# Patient Record
Sex: Female | Born: 1950 | Race: Black or African American | Hispanic: No | Smoking: Never smoker
Health system: Southern US, Community
[De-identification: ages and names within clinical notes are randomized; demographics above are authoritative.]

## PROBLEM LIST (undated history)

## (undated) DIAGNOSIS — I10 Essential (primary) hypertension: Secondary | ICD-10-CM

## (undated) DIAGNOSIS — G43909 Migraine, unspecified, not intractable, without status migrainosus: Secondary | ICD-10-CM

## (undated) DIAGNOSIS — K219 Gastro-esophageal reflux disease without esophagitis: Secondary | ICD-10-CM

## (undated) DIAGNOSIS — E119 Type 2 diabetes mellitus without complications: Secondary | ICD-10-CM

## (undated) DIAGNOSIS — M719 Bursopathy, unspecified: Secondary | ICD-10-CM

## (undated) HISTORY — PX: LUNG REMOVAL, PARTIAL: SHX233

## (undated) HISTORY — PX: ABDOMINAL HYSTERECTOMY: SHX81

---

## 2004-09-26 ENCOUNTER — Emergency Department (HOSPITAL_COMMUNITY): Admission: EM | Admit: 2004-09-26 | Discharge: 2004-09-26 | Payer: Self-pay | Admitting: Emergency Medicine

## 2014-10-13 ENCOUNTER — Encounter (HOSPITAL_COMMUNITY): Payer: Self-pay | Admitting: Emergency Medicine

## 2014-10-13 ENCOUNTER — Emergency Department (HOSPITAL_COMMUNITY): Payer: BC Managed Care – PPO

## 2014-10-13 ENCOUNTER — Observation Stay (HOSPITAL_COMMUNITY)
Admission: EM | Admit: 2014-10-13 | Discharge: 2014-10-15 | Disposition: A | Discharge status: Home / Self Care | Payer: BC Managed Care – PPO | Attending: Internal Medicine | Admitting: Internal Medicine

## 2014-10-13 DIAGNOSIS — Z90710 Acquired absence of both cervix and uterus: Secondary | ICD-10-CM | POA: Diagnosis not present

## 2014-10-13 DIAGNOSIS — Z885 Allergy status to narcotic agent status: Secondary | ICD-10-CM | POA: Insufficient documentation

## 2014-10-13 DIAGNOSIS — Z79899 Other long term (current) drug therapy: Secondary | ICD-10-CM | POA: Insufficient documentation

## 2014-10-13 DIAGNOSIS — G43909 Migraine, unspecified, not intractable, without status migrainosus: Secondary | ICD-10-CM | POA: Diagnosis not present

## 2014-10-13 DIAGNOSIS — Z888 Allergy status to other drugs, medicaments and biological substances status: Secondary | ICD-10-CM | POA: Diagnosis not present

## 2014-10-13 DIAGNOSIS — I951 Orthostatic hypotension: Secondary | ICD-10-CM | POA: Diagnosis not present

## 2014-10-13 DIAGNOSIS — E119 Type 2 diabetes mellitus without complications: Secondary | ICD-10-CM

## 2014-10-13 DIAGNOSIS — I10 Essential (primary) hypertension: Secondary | ICD-10-CM | POA: Diagnosis not present

## 2014-10-13 DIAGNOSIS — R55 Syncope and collapse: Secondary | ICD-10-CM

## 2014-10-13 DIAGNOSIS — R42 Dizziness and giddiness: Secondary | ICD-10-CM

## 2014-10-13 HISTORY — DX: Migraine, unspecified, not intractable, without status migrainosus: G43.909

## 2014-10-13 HISTORY — DX: Type 2 diabetes mellitus without complications: E11.9

## 2014-10-13 HISTORY — DX: Essential (primary) hypertension: I10

## 2014-10-13 LAB — TROPONIN I: Troponin I: <0.03 ng/mL (ref <0.031)

## 2014-10-13 LAB — BASIC METABOLIC PANEL
ANION GAP: 11 (ref 5–15)
BUN: 15 mg/dL (ref 6–20)
CO2: 28 mmol/L (ref 22–32)
CREATININE: 1.14 mg/dL — AB (ref 0.44–1.00)
Calcium: 9.6 mg/dL (ref 8.9–10.3)
Chloride: 105 mmol/L (ref 101–111)
GFR calc Af Amer: 58 mL/min — ABNORMAL LOW (ref >60)
GFR, EST NON AFRICAN AMERICAN: 50 mL/min — AB (ref >60)
Glucose, Bld: 149 mg/dL — ABNORMAL HIGH (ref 65–99)
POTASSIUM: 3.4 mmol/L — AB (ref 3.5–5.1)
SODIUM: 144 mmol/L (ref 135–145)

## 2014-10-13 LAB — CBC
HEMATOCRIT: 39.6 % (ref 36.0–46.0)
HEMOGLOBIN: 12.4 g/dL (ref 12.0–15.0)
MCH: 27.6 pg (ref 26.0–34.0)
MCHC: 31.3 g/dL (ref 30.0–36.0)
MCV: 88.2 fL (ref 78.0–100.0)
PLATELETS: 301 10*3/uL (ref 150–400)
RBC: 4.49 MIL/uL (ref 3.87–5.11)
RDW: 13.5 % (ref 11.5–15.5)
WBC: 8.4 10*3/uL (ref 4.0–10.5)

## 2014-10-13 LAB — URINALYSIS, ROUTINE W REFLEX MICROSCOPIC
Glucose, UA: NEGATIVE mg/dL
HGB URINE DIPSTICK: NEGATIVE
KETONES UR: NEGATIVE mg/dL
NITRITE: NEGATIVE
Protein, ur: NEGATIVE mg/dL
Specific Gravity, Urine: 1.022 (ref 1.005–1.030)
UROBILINOGEN UA: 1 mg/dL (ref 0.0–1.0)
pH: 7.5 (ref 5.0–8.0)

## 2014-10-13 LAB — URINE MICROSCOPIC-ADD ON

## 2014-10-13 LAB — GLUCOSE, CAPILLARY
GLUCOSE-CAPILLARY: 133 mg/dL — AB (ref 65–99)
GLUCOSE-CAPILLARY: 154 mg/dL — AB (ref 65–99)

## 2014-10-13 MED ORDER — LABETALOL HCL 300 MG PO TABS
300.0000 mg | ORAL_TABLET | Freq: Two times a day (BID) | ORAL | Status: DC
  Administered 2014-10-13: 300 mg via ORAL
  Filled 2014-10-13 (×3): qty 1

## 2014-10-13 MED ORDER — ACETAMINOPHEN 650 MG RE SUPP: 650.0000 mg | Freq: Four times a day (QID) | RECTAL | Status: DC | PRN

## 2014-10-13 MED ORDER — DULOXETINE HCL 60 MG PO CPEP
60.0000 mg | ORAL_CAPSULE | Freq: Every day | ORAL | Status: DC
  Administered 2014-10-14 – 2014-10-15 (×2): 60 mg via ORAL
  Filled 2014-10-13 (×2): qty 1

## 2014-10-13 MED ORDER — SODIUM CHLORIDE 0.9 % IV BOLUS (SEPSIS)
1000.0000 mL | Freq: Once | INTRAVENOUS | Status: AC
  Administered 2014-10-13: 1000 mL via INTRAVENOUS

## 2014-10-13 MED ORDER — ONDANSETRON HCL 4 MG/2ML IJ SOLN: 4.0000 mg | Freq: Four times a day (QID) | INTRAMUSCULAR | Status: DC | PRN

## 2014-10-13 MED ORDER — MECLIZINE HCL 25 MG PO TABS
25.0000 mg | ORAL_TABLET | Freq: Once | ORAL | Status: AC
  Administered 2014-10-13: 25 mg via ORAL
  Filled 2014-10-13: qty 1

## 2014-10-13 MED ORDER — PANTOPRAZOLE SODIUM 40 MG PO TBEC
40.0000 mg | DELAYED_RELEASE_TABLET | Freq: Every day | ORAL | Status: DC
  Administered 2014-10-13 – 2014-10-15 (×3): 40 mg via ORAL
  Filled 2014-10-13 (×3): qty 1

## 2014-10-13 MED ORDER — SODIUM CHLORIDE 0.9 % IV SOLN
INTRAVENOUS | Status: DC
  Administered 2014-10-13 – 2014-10-15 (×3): via INTRAVENOUS

## 2014-10-13 MED ORDER — ACETAMINOPHEN 325 MG PO TABS
650.0000 mg | ORAL_TABLET | Freq: Four times a day (QID) | ORAL | Status: DC | PRN
  Administered 2014-10-13 – 2014-10-14 (×3): 650 mg via ORAL
  Filled 2014-10-13 (×3): qty 2

## 2014-10-13 MED ORDER — POTASSIUM CHLORIDE CRYS ER 20 MEQ PO TBCR
40.0000 meq | EXTENDED_RELEASE_TABLET | Freq: Once | ORAL | Status: AC
  Administered 2014-10-13: 40 meq via ORAL
  Filled 2014-10-13: qty 2

## 2014-10-13 MED ORDER — INSULIN ASPART 100 UNIT/ML ~~LOC~~ SOLN: 0.0000 [IU] | Freq: Three times a day (TID) | SUBCUTANEOUS | Status: DC

## 2014-10-13 MED ORDER — ONDANSETRON HCL 4 MG PO TABS: 4.0000 mg | ORAL_TABLET | Freq: Four times a day (QID) | ORAL | Status: DC | PRN

## 2014-10-13 NOTE — ED Notes (Addendum)
[place] to [place] pt can go at 2:20 pm...klj

## 2014-10-13 NOTE — ED Notes (Signed)
Bed: WA03 Expected date:  Expected time:  Means of arrival:  Comments: EMS- syncope, dizziness

## 2014-10-13 NOTE — H&P (Signed)
PCP:   Elspeth ChoERRELL,GRACE E, MD   Chief Complaint:  Passed out  HPI:  64 year old female who   has a past medical history of Hypertension; Diabetes mellitus without complication; and Migraines.  patient was brought to the hospital after she passed out at her workplace. As per patient when she woke up this morning she felt a little dizzy, she took her morning medications and went to her workplace. While she was fixing coffee patient doesn't remember but then found herself on the floor with vomitus around her. She also lost her bowel control and found stool all over the place. Patient denies any history of seizures, she denies chest pain or shortness of breath. She was not feeling sick before this happened. In the ED patient was found to have orthostatic hypotension with systolic blood pressure dropping around 20 points. Patient was given IV fluids and this time patient's blood pressure has improved. She does take labetalol 300 twice a day, Hyzaar 100/25 daily. Patient had an occipital nerve block 2 months ago for migraines  Allergies:   Allergies  Allergen Reactions  . Codeine Itching  . Tramadol Rash      Past Medical History  Diagnosis Date  . Hypertension   . Diabetes mellitus without complication   . Migraines     Past Surgical History  Procedure Laterality Date  . Abdominal hysterectomy    . Lung removal, partial      Prior to Admission medications   Medication Sig Start Date End Date Taking? Authorizing Provider  Clobetasol Propionate (TEMOVATE) 0.05 % external spray Apply topically as directed. 10/09/14  Yes Historical Provider, MD  DULoxetine (CYMBALTA) 60 MG capsule Take 60 mg by mouth daily. 10/05/14  Yes Historical Provider, MD  labetalol (NORMODYNE) 300 MG tablet Take 300 mg by mouth 2 (two) times daily. 10/12/14  Yes Historical Provider, MD  losartan-hydrochlorothiazide (HYZAAR) 100-25 MG per tablet Take 1 tablet by mouth daily. 09/28/14  Yes Historical Provider, MD    VENTOLIN HFA 108 (90 BASE) MCG/ACT inhaler Inhale 2 puffs into the lungs every 4 (four) hours as needed for wheezing or shortness of breath.  09/24/14  Yes Historical Provider, MD  SUMAtriptan (IMITREX) 50 MG tablet Take 1 tablet by mouth once as needed. Migraine.  May repeat every 2 hours if no relief.  Max 4 tabs/24 hours. 07/14/14   Historical Provider, MD  Allene DillonVANATOL LQ [id number] MG/15ML SOLN Take by mouth as directed. 09/23/14   Historical Provider, MD    Social History:  reports that she has never smoked. She does not have any smokeless tobacco history on file. She reports that she drinks alcohol. Her drug history is not on file.  No family history of stroke or cancer in  All the positives are listed in BOLD  Review of Systems:  HEENT: Headache, blurred vision, runny nose, sore throat Neck: Hypothyroidism, hyperthyroidism,,lymphadenopathy Chest : Shortness of breath, history of COPD, Asthma Heart : Chest pain, history of coronary arterey disease GI:  Nausea, vomiting, diarrhea, constipation, GERD GU: Dysuria, urgency, frequency of urination, hematuria Neuro: Stroke, seizures, syncope, migraine Psych: Depression, anxiety, hallucinations   Physical Exam: Blood pressure 107/71, pulse 69, temperature 97.8 F (36.6 C), temperature source Oral, resp. rate 18, SpO2 100 %. Constitutional:   Patient is a well-developed and well-nourished * in no acute distress and cooperative with exam. Head: Normocephalic and atraumatic Mouth: Mucus membranes moist Eyes: PERRL, EOMI, conjunctivae normal Neck: Supple, No Thyromegaly Cardiovascular: RRR, S1 normal, S2 normal Pulmonary/Chest:  CTAB, no wheezes, rales, or rhonchi Abdominal: Soft. Non-tender, non-distended, bowel sounds are normal, no masses, organomegaly, or guarding present.  Neurological: A&O x3, Strength is normal and symmetric bilaterally, cranial nerve II-XII are grossly intact, no focal motor deficit, sensory intact to light touch  bilaterally.  Extremities : No Cyanosis, Clubbing or Edema  Labs on Admission:  Basic Metabolic Panel:  Recent Labs Lab 10/13/14 1153  NA 144  K 3.4*  CL 105  CO2 28  GLUCOSE 149*  BUN 15  CREATININE 1.14*  CALCIUM 9.6   Liver Function Tests: No results for input(s): AST, ALT, ALKPHOS, BILITOT, PROT, ALBUMIN in the last 168 hours. No results for input(s): LIPASE, AMYLASE in the last 168 hours. No results for input(s): AMMONIA in the last 168 hours. CBC:  Recent Labs Lab 10/13/14 1153  WBC 8.4  HGB 12.4  HCT 39.6  MCV 88.2  PLT 301   Cardiac Enzymes:  Recent Labs Lab 10/13/14 1153  TROPONINI <0.03     Radiological Exams on Admission: Dg Chest 2 View  10/13/2014   CLINICAL DATA:  Dizziness and syncopal episode  EXAM: CHEST  2 VIEW  COMPARISON:  10/20/2013  FINDINGS: Cardiac shadow is within normal limits. The lungs are clear bilaterally. No acute bony abnormality is seen.  IMPRESSION: No active cardiopulmonary disease.   Electronically Signed   By: Alcide Clever M.D.   On: 10/13/2014 12:56   Ct Head Wo Contrast  10/13/2014   CLINICAL DATA:  Making coffee at work and became dizzy and nauseated.  EXAM: CT HEAD WITHOUT CONTRAST  TECHNIQUE: Contiguous axial images were obtained from the base of the skull through the vertex without intravenous contrast.  COMPARISON:  09/26/2004.  FINDINGS: No evidence for acute infarction, hemorrhage, mass lesion, hydrocephalus, or extra-axial fluid. No atrophy or white matter disease. Intact calvarium. No acute sinus or mastoid disease. Similar appearance to priors.  IMPRESSION: Negative exam.   Electronically Signed   By: Davonna Belling M.D.   On: 10/13/2014 12:48    EKG: Independently reviewed. Sinus rhythm   Assessment/Plan Active Problems:   Syncope   HTN (hypertension)   Diabetes mellitus  Syncope versus seizure Patient had episode of loss of adjustments, also lost bowel control and had vomiting. We'll admit the patient under  telemetry, obtain serial cardiac enzymes. We'll also check EEG to rule out underlying seizure disorder. Patient does not have history of seizures in the past. CT scan head done today is negative for any acute abnormality.  Orthostatic hypotension Patient was found to be orthostatic with systolic blood pressure dropping more than 20 points. Will hold Hyzaar at this time, continue labetalol.  Diabetes mellitus Diet-controlled, will initiate sliding scale insulin with NovoLog.  DVT prophylaxis SCDs, patient does not want Lovenox as she remotely had a brain bleed seen on the MRI.  Code status: Full code  Family discussion: No family at bedside   Time Spent on Admission: 55 minutes  LAMA,GAGAN S [facility] Pager: [phone] 10/13/2014, 2:03 PM  If 7PM-7AM, please contact night-coverage  [email or web address]  Password [email or web address]

## 2014-10-13 NOTE — ED Notes (Signed)
Initial Contact - pt A+Ox4, reports feeling "not quite right" this AM, sts was making herself coffee at work and was suddenly dizzy and very nauseated.  Pt reports "then I woke up on the floor covered in vomit and poo".  Pt reports dizziness with position changes at this time.  Pt denies cp/palpitations or SOB now or prior to event.  NSR noted on monitor, no ectopy.  No active vomiting or nausea.  Abd s/nt/nd.  Skin PWD.  MAEI, neuros grossly intact.  Ambulatory with steady gait to void in BR.  Speaking full/clear sentences.  Changed to hospital gown, placed to cardiac/02 monitor.  NAD.

## 2014-10-13 NOTE — ED Notes (Signed)
Pt to CT

## 2014-10-13 NOTE — Progress Notes (Addendum)
1429 Attending MD paged (admission)  1430 Call from attending see new order

## 2014-10-13 NOTE — ED Provider Notes (Signed)
CSN: [id number]     Arrival date & time 10/13/14  1122 History   First MD Initiated Contact with Patient 10/13/14 1143     Chief Complaint  Patient presents with  . Dizziness    this AM  . Loss of Consciousness    this AM while at work, with vomiting/diarrhea     (Consider location/radiation/quality/duration/timing/severity/associated sxs/prior Treatment) HPI Comments: Pt comes in with c/o dizziness that started while fixing her coffee. Pt states that she woke up on the ground and had vomit and diarrhea all over her. She denies history of similar symptoms. She states that she is just tired now. She states that the dizziness was "swimming in her head". She denies vision changes, cp, sob. She states that she does have a history of headache and she has one today and she states that closing her eyes helps the problem.  The history is provided by the patient. No language interpreter was used.    Past Medical History  Diagnosis Date  . Hypertension   . Diabetes mellitus without complication   . Migraines    Past Surgical History  Procedure Laterality Date  . Abdominal hysterectomy    . Lung removal, partial     No family history on file. History  Substance Use Topics  . Smoking status: Never Smoker   . Smokeless tobacco: Not on file  . Alcohol Use: Yes     Comment: glass wine/night   OB History    No data available     Review of Systems  All other systems reviewed and are negative.     Allergies  Codeine and Tramadol  Home Medications   Prior to Admission medications   Medication Sig Start Date End Date Taking? Authorizing Provider  Clobetasol Propionate (TEMOVATE) 0.05 % external spray Apply topically as directed. 10/09/14  Yes Historical Provider, MD  DULoxetine (CYMBALTA) 60 MG capsule Take 60 mg by mouth daily. 10/05/14  Yes Historical Provider, MD  labetalol (NORMODYNE) 300 MG tablet Take 300 mg by mouth 2 (two) times daily. 10/12/14  Yes Historical Provider, MD   losartan-hydrochlorothiazide (HYZAAR) 100-25 MG per tablet Take 1 tablet by mouth daily. 09/28/14  Yes Historical Provider, MD  VENTOLIN HFA 108 (90 BASE) MCG/ACT inhaler Inhale 2 puffs into the lungs every 4 (four) hours as needed for wheezing or shortness of breath.  09/24/14  Yes Historical Provider, MD  SUMAtriptan (IMITREX) 50 MG tablet Take 1 tablet by mouth once as needed. Migraine.  May repeat every 2 hours if no relief.  Max 4 tabs/24 hours. 07/14/14   Historical Provider, MD  Allene DillonVANATOL LQ [phone] MG/15ML SOLN Take by mouth as directed. 09/23/14   Historical Provider, MD   BP 130/91 mmHg  Pulse 68  Temp(Src) 97.8 F (36.6 C) (Oral)  Resp 18  SpO2 99% Physical Exam  Constitutional: She is oriented to person, place, and time. She appears well-developed and well-nourished.  Cardiovascular: Normal rate and regular rhythm.   Pulmonary/Chest: Effort normal and breath sounds normal.  Abdominal: Soft. Bowel sounds are normal. There is no tenderness.  Musculoskeletal: Normal range of motion.  Neurological: She is alert and oriented to person, place, and time. Coordination normal.  No pronator drift. Normal finger to nose. Moving all extremities without any problem  Skin: Skin is warm and dry.  Psychiatric: She has a normal mood and affect.  Nursing note and vitals reviewed.   ED Course  Procedures (including critical care time) Labs Review Labs Reviewed  BASIC METABOLIC PANEL - Abnormal; Notable for the following:    Potassium 3.4 (*)    Glucose, Bld 149 (*)    Creatinine, Ser 1.14 (*)    GFR calc non Af Amer 50 (*)    GFR calc Af Amer 58 (*)    All other components within normal limits  URINALYSIS, ROUTINE W REFLEX MICROSCOPIC - Abnormal; Notable for the following:    Color, Urine AMBER (*)    APPearance TURBID (*)    Bilirubin Urine SMALL (*)    Leukocytes, UA SMALL (*)    All other components within normal limits  URINE MICROSCOPIC-ADD ON - Abnormal; Notable for the following:     Bacteria, UA MANY (*)    Casts HYALINE CASTS (*)    All other components within normal limits  CBC  TROPONIN I    Imaging Review Ct Head Wo Contrast  10/13/2014   CLINICAL DATA:  Making coffee at work and became dizzy and nauseated.  EXAM: CT HEAD WITHOUT CONTRAST  TECHNIQUE: Contiguous axial images were obtained from the base of the skull through the vertex without intravenous contrast.  COMPARISON:  09/26/2004.  FINDINGS: No evidence for acute infarction, hemorrhage, mass lesion, hydrocephalus, or extra-axial fluid. No atrophy or white matter disease. Intact calvarium. No acute sinus or mastoid disease. Similar appearance to priors.  IMPRESSION: Negative exam.   Electronically Signed   By: Davonna Belling M.D.   On: 10/13/2014 12:48     EKG Interpretation   Date/Time:  Tuesday Oct 13 2014 11:45:34 EDT Ventricular Rate:  66 PR Interval:  148 QRS Duration: 99 QT Interval:  432 QTC Calculation: 453 R Axis:   82 Text Interpretation:  Sinus rhythm Borderline right axis deviation  Borderline T abnormalities, lateral leads Confirmed by Rubin Payor  MD,  Harrold Donath [phone]) on 10/13/2014 1:13:47 PM      MDM   Final diagnoses:  Dizziness  Syncope, unspecified syncope type  Orthostatic hypotension    Pt is ambulatory on her own. Pt is orthostatic. Pt is okay to go home    Teressa Lower, NP 10/13/14 1334  Benjiman Core, MD 10/13/14 [phone]

## 2014-10-13 NOTE — ED Notes (Signed)
Pt return from radiology, resting on stretcher with eyes closed.  Denies needs/complaints at this time.  Call light in reach.  NAD.

## 2014-10-13 NOTE — ED Notes (Signed)
PER EMS - pt from work with unwitnessed syncopal episode and c/o dizziness this AM.  Pt reports vomiting and having diarrhea on herself during event.  Pt continues to c/o dizziness, reports improved with laying down and closing eyes.    4mg  Zofran IVP x1 - with improvement

## 2014-10-13 NOTE — Progress Notes (Signed)
Pt updated CM her pcp is Dr Landry Mellowerrell  EPIC updated

## 2014-10-14 ENCOUNTER — Observation Stay (HOSPITAL_COMMUNITY)
Admit: 2014-10-14 | Discharge: 2014-10-14 | Disposition: A | Discharge status: Home / Self Care | Payer: BC Managed Care – PPO | Attending: Family Medicine | Admitting: Family Medicine

## 2014-10-14 ENCOUNTER — Observation Stay (HOSPITAL_BASED_OUTPATIENT_CLINIC_OR_DEPARTMENT_OTHER): Payer: BC Managed Care – PPO

## 2014-10-14 DIAGNOSIS — R55 Syncope and collapse: Secondary | ICD-10-CM

## 2014-10-14 DIAGNOSIS — E119 Type 2 diabetes mellitus without complications: Secondary | ICD-10-CM | POA: Diagnosis not present

## 2014-10-14 DIAGNOSIS — I10 Essential (primary) hypertension: Secondary | ICD-10-CM | POA: Diagnosis not present

## 2014-10-14 LAB — COMPREHENSIVE METABOLIC PANEL
ALT: 15 U/L (ref 14–54)
ANION GAP: 8 (ref 5–15)
AST: 18 U/L (ref 15–41)
Albumin: 3.4 g/dL — ABNORMAL LOW (ref 3.5–5.0)
Alkaline Phosphatase: 94 U/L (ref 38–126)
BILIRUBIN TOTAL: 0.4 mg/dL (ref 0.3–1.2)
BUN: 16 mg/dL (ref 6–20)
CALCIUM: 9.5 mg/dL (ref 8.9–10.3)
CO2: 29 mmol/L (ref 22–32)
Chloride: 104 mmol/L (ref 101–111)
Creatinine, Ser: 1.09 mg/dL — ABNORMAL HIGH (ref 0.44–1.00)
GFR calc Af Amer: >60 mL/min (ref >60)
GFR, EST NON AFRICAN AMERICAN: 52 mL/min — AB (ref >60)
Glucose, Bld: 149 mg/dL — ABNORMAL HIGH (ref 65–99)
Potassium: 3.5 mmol/L (ref 3.5–5.1)
SODIUM: 141 mmol/L (ref 135–145)
Total Protein: 7 g/dL (ref 6.5–8.1)

## 2014-10-14 LAB — CBC
HEMATOCRIT: 39 % (ref 36.0–46.0)
HEMOGLOBIN: 12 g/dL (ref 12.0–15.0)
MCH: 27.6 pg (ref 26.0–34.0)
MCHC: 30.8 g/dL (ref 30.0–36.0)
MCV: 89.7 fL (ref 78.0–100.0)
Platelets: 312 10*3/uL (ref 150–400)
RBC: 4.35 MIL/uL (ref 3.87–5.11)
RDW: 13.8 % (ref 11.5–15.5)
WBC: 8.6 10*3/uL (ref 4.0–10.5)

## 2014-10-14 LAB — GLUCOSE, CAPILLARY
GLUCOSE-CAPILLARY: 95 mg/dL (ref 65–99)
Glucose-Capillary: 97 mg/dL (ref 65–99)
Glucose-Capillary: 100 mg/dL — ABNORMAL HIGH (ref 65–99)
Glucose-Capillary: 92 mg/dL (ref 65–99)

## 2014-10-14 LAB — TROPONIN I

## 2014-10-14 NOTE — Progress Notes (Signed)
EEG completed; results pending.    

## 2014-10-14 NOTE — Progress Notes (Signed)
  Echocardiogram 2D Echocardiogram has been performed.  Janalyn HarderWest, Veeda Virgo R 10/14/2014, 2:17 PM

## 2014-10-14 NOTE — Progress Notes (Signed)
[facility] PROGRESS NOTE  Mercedes Riggs EAV:[id number] DOB: 01-28-1951 DOA: 10/13/2014 PCP: Elspeth Cho, MD Interim summary: 64 year old female who  has a past medical history of Hypertension; Diabetes mellitus without complication; and Migraines. patient was brought to the hospital after she passed out at her workplace. Assessment/Plan: 1. Syncope: Unclear etiology, probably orthostatic.  Stopped BP meds and her repeat orthostatics negative.  EEG negative for seizures.  Echocardiogram pending.  Over night tele did not show any arrhythmias   Migraine headache: improved with tylenol.   Hypertension: controlled.      Code Status: full code Family Communication: son at bedside Disposition Plan: pending.    Consultants:  none  Procedures:  EEG  ECHO.   Antibiotics:  none  HPI/Subjective: Migraine headache, needs tylenol.   Objective: Filed Vitals:   10/14/14 1403  BP:   Pulse:   Temp: 98.1 F (36.7 C)  Resp: 18    Intake/Output Summary (Last 24 hours) at 10/14/14 1615 Last data filed at 10/14/14 1500  Gross per 24 hour  Intake   1105 ml  Output      3 ml  Net   1102 ml   Filed Weights   10/13/14 1446  Weight: 77.6 kg (171 lb 1.2 oz)    Exam:   General:  Alert afebrile comfortable  Cardiovascular: s1s2  Respiratory: CTAB.   Abdomen: SOFT non tender non distended, bowel sounds heard.   Musculoskeletal: no pedal edema.   Data Reviewed: Basic Metabolic Panel:  Recent Labs Lab 10/13/14 1153 10/14/14 0258  NA 144 141  K 3.4* 3.5  CL 105 104  CO2 28 29  GLUCOSE 149* 149*  BUN 15 16  CREATININE 1.14* 1.09*  CALCIUM 9.6 9.5   Liver Function Tests:  Recent Labs Lab 10/14/14 0258  AST 18  ALT 15  ALKPHOS 94  BILITOT 0.4  PROT 7.0  ALBUMIN 3.4*   No results for input(s): LIPASE, AMYLASE in the last 168 hours. No results for input(s): AMMONIA in the last 168 hours. CBC:  Recent Labs Lab 10/13/14 1153  10/14/14 0258  WBC 8.4 8.6  HGB 12.4 12.0  HCT 39.6 39.0  MCV 88.2 89.7  PLT 301 312   Cardiac Enzymes:  Recent Labs Lab 10/13/14 1153 10/13/14 1510 10/13/14 2120 10/14/14 0258  TROPONINI <0.03 <0.03 <0.03 <0.03   BNP (last 3 results) No results for input(s): BNP in the last 8760 hours.  ProBNP (last 3 results) No results for input(s): PROBNP in the last 8760 hours.  CBG:  Recent Labs Lab 10/13/14 1656 10/13/14 2228 10/14/14 0809 10/14/14 1139  GLUCAP 133* 154* 97 95    No results found for this or any previous visit (from the past 240 hour(s)).   Studies: Dg Chest 2 View  10/13/2014   CLINICAL DATA:  Dizziness and syncopal episode  EXAM: CHEST  2 VIEW  COMPARISON:  10/20/2013  FINDINGS: Cardiac shadow is within normal limits. The lungs are clear bilaterally. No acute bony abnormality is seen.  IMPRESSION: No active cardiopulmonary disease.   Electronically Signed   By: Alcide Clever M.D.   On: 10/13/2014 12:56   Ct Head Wo Contrast  10/13/2014   CLINICAL DATA:  Making coffee at work and became dizzy and nauseated.  EXAM: CT HEAD WITHOUT CONTRAST  TECHNIQUE: Contiguous axial images were obtained from the base of the skull through the vertex without intravenous contrast.  COMPARISON:  09/26/2004.  FINDINGS: No evidence for acute infarction, hemorrhage, mass lesion, hydrocephalus,  or extra-axial fluid. No atrophy or white matter disease. Intact calvarium. No acute sinus or mastoid disease. Similar appearance to priors.  IMPRESSION: Negative exam.   Electronically Signed   By: Davonna BellingJohn  Curnes M.D.   On: 10/13/2014 12:48    Scheduled Meds: . DULoxetine  60 mg Oral Daily  . pantoprazole  40 mg Oral Daily   Continuous Infusions: . sodium chloride 75 mL/hr at 10/13/14 1652    Active Problems:   Syncope   HTN (hypertension)   Diabetes mellitus    Time spent: 25 min    Mercedes Riggs  [facility] Pager [phone]  If 7PM-7AM, please contact night-coverage at  [email or web address], password [email or web address] 10/14/2014, 4:15 PM  LOS: 1 day

## 2014-10-14 NOTE — Procedures (Addendum)
History: Previous aneurysm clippings with bilateral craniotomies.   Sedation: None  Technique: This is a 19 channel routine scalp EEG performed at the bedside with bipolar and monopolar montages arranged in accordance to the international 10/20 system of electrode placement. One channel was dedicated to EKG recording.    Background: The background consists of intermixed alpha and beta activities. There is a well defined posterior dominant rhythm of 9 Hz that attenuates with eye opening. Sleep is recorded with normal appearing structures.   She has a prominent bitemporal wicket rhythm that is sharply contoured.  Photic stimulation: Physiologic driving is not performed  EEG Abnormalities: None  Clinical Interpretation: This normal EEG is recorded in the waking and sleep state. . There was no seizure or seizure predisposition recorded on this study.   Mercedes Riggs [name], MD [facility] [phone]  If 7pm- 7am, please page neurology on call as listed in AMION.

## 2014-10-15 ENCOUNTER — Other Ambulatory Visit: Payer: Self-pay | Admitting: Cardiology

## 2014-10-15 DIAGNOSIS — R55 Syncope and collapse: Secondary | ICD-10-CM

## 2014-10-15 DIAGNOSIS — E119 Type 2 diabetes mellitus without complications: Secondary | ICD-10-CM | POA: Diagnosis not present

## 2014-10-15 LAB — HEMOGLOBIN A1C
HEMOGLOBIN A1C: 6.5 % — AB (ref 4.8–5.6)
MEAN PLASMA GLUCOSE: 140 mg/dL

## 2014-10-15 LAB — GLUCOSE, CAPILLARY: Glucose-Capillary: 112 mg/dL — ABNORMAL HIGH (ref 65–99)

## 2014-10-15 MED ORDER — HEART RATE MONITOR MISC: 1.0000 [IU] | Status: DC

## 2014-10-15 MED ORDER — HYDRALAZINE HCL 20 MG/ML IJ SOLN
5.0000 mg | INTRAMUSCULAR | Status: DC | PRN
  Administered 2014-10-15: 5 mg via INTRAVENOUS
  Filled 2014-10-15: qty 1

## 2014-10-15 NOTE — Discharge Summary (Signed)
Physician Discharge Summary  Mercedes AGENT YQI:[id number] DOB: Jun 21, 1950 DOA: 10/13/2014  PCP: Elspeth Cho, MD  Admit date: 10/13/2014 Discharge date: 10/15/2014  Time spent: 30 minutes  Recommendations for Outpatient Follow-up:  1. Follow up with PCP in one week 2. Follow up with cardiology for event monitor placement.  3. Recommend using CPAP machine for sleep apnea.   Discharge Diagnoses:  Active Problems:   Syncope   HTN (hypertension)   Diabetes mellitus   Discharge Condition: improved.   Diet recommendation: low sodium and carb modified diet  Filed Weights   10/13/14 1446  Weight: 77.6 kg (171 lb 1.2 oz)    History of present illness:  64 year old female who has a past medical history of Hypertension; Diabetes mellitus without complication; and Migraines. patient was brought to the hospital after she passed out at her workplace.  Hospital Course:  1. Syncope: Unclear etiology, probably orthostatic.  Stopped BP meds and her repeat orthostatics negative.  EEG negative for seizures.  Echocardiogram does not show anysignificant abnormalities.  Over night tele did not show any arrhythmias. Outpatient event monitor placement. Called cardiology.    Migraine headache: improved with tylenol.   Hypertension: controlled.   Procedures:  Riggs  Consultations:  Riggs  Discharge Exam: Filed Vitals:   10/15/14 0638  BP: 157/84  Pulse: 73  Temp:   Resp:     General: alert afebrile comfortable Cardiovascular: s1s2 Respiratory: ctab  Discharge Instructions   Discharge Instructions    Diet - low sodium heart healthy    Complete by:  As directed      Discharge instructions    Complete by:  As directed   Please follow up with cardiology for event monitor to be placed to check for arrhthymias.  We have stopped losartan and HCTZ as you were orthostatic and dehydrated. Please follow up with PCP regarding resuming the medication.   Please use CPAP  every day.          Current Discharge Medication List    CONTINUE these medications which have NOT CHANGED   Details  Clobetasol Propionate (TEMOVATE) 0.05 % external spray Apply topically as directed.    DULoxetine (CYMBALTA) 60 MG capsule Take 60 mg by mouth daily.    labetalol (NORMODYNE) 300 MG tablet Take 300 mg by mouth 2 (two) times daily.    VENTOLIN HFA 108 (90 BASE) MCG/ACT inhaler Inhale 2 puffs into the lungs every 4 (four) hours as needed for wheezing or shortness of breath.  Refills: 11    SUMAtriptan (IMITREX) 50 MG tablet Take 1 tablet by mouth once as needed. Migraine.  May repeat every 2 hours if no relief.  Max 4 tabs/24 hours. Refills: 5    VANATOL LQ 50-325-40 MG/15ML SOLN Take by mouth as directed.      STOP taking these medications     losartan-hydrochlorothiazide (HYZAAR) 100-25 MG per tablet        Allergies  Allergen Reactions  . Codeine Itching  . Tramadol Rash   Follow-up Information    Follow up with [facility], MD. Schedule an appointment as soon as possible for a visit in 1 week.   Specialty:  Internal Medicine   Contact information:   [address] [place] [address] [phone]       Follow up with [facility].   Specialty:  Cardiology   Why:  for event monitor to be placed for syncope.    Contact information:  [address], [address] [place] [id number] [phone]       The results of significant diagnostics from this hospitalization (including imaging, microbiology, ancillary and laboratory) are listed below for reference.    Significant Diagnostic Studies: Dg Chest 2 View  10/13/2014   CLINICAL DATA:  Dizziness and syncopal episode  EXAM: CHEST  2 VIEW  COMPARISON:  10/20/2013  FINDINGS: Cardiac shadow is within normal limits. The lungs are clear bilaterally. No acute bony abnormality is seen.  IMPRESSION: No active cardiopulmonary disease.    Electronically Signed   By: Alcide CleverMark  Lukens M.D.   On: 10/13/2014 12:56   Ct Head Wo Contrast  10/13/2014   CLINICAL DATA:  Making coffee at work and became dizzy and nauseated.  EXAM: CT HEAD WITHOUT CONTRAST  TECHNIQUE: Contiguous axial images were obtained from the base of the skull through the vertex without intravenous contrast.  COMPARISON:  09/26/2004.  FINDINGS: No evidence for acute infarction, hemorrhage, mass lesion, hydrocephalus, or extra-axial fluid. No atrophy or white matter disease. Intact calvarium. No acute sinus or mastoid disease. Similar appearance to priors.  IMPRESSION: Negative exam.   Electronically Signed   By: Davonna BellingJohn  Curnes M.D.   On: 10/13/2014 12:48    Microbiology: No results found for this or any previous visit (from the past 240 hour(s)).   Labs: Basic Metabolic Panel:  Recent Labs Lab 10/13/14 1153 10/14/14 0258  NA 144 141  K 3.4* 3.5  CL 105 104  CO2 28 29  GLUCOSE 149* 149*  BUN 15 16  CREATININE 1.14* 1.09*  CALCIUM 9.6 9.5   Liver Function Tests:  Recent Labs Lab 10/14/14 0258  AST 18  ALT 15  ALKPHOS 94  BILITOT 0.4  PROT 7.0  ALBUMIN 3.4*   No results for input(s): LIPASE, AMYLASE in the last 168 hours. No results for input(s): AMMONIA in the last 168 hours. CBC:  Recent Labs Lab 10/13/14 1153 10/14/14 0258  WBC 8.4 8.6  HGB 12.4 12.0  HCT 39.6 39.0  MCV 88.2 89.7  PLT 301 312   Cardiac Enzymes:  Recent Labs Lab 10/13/14 1153 10/13/14 1510 10/13/14 2120 10/14/14 0258  TROPONINI <0.03 <0.03 <0.03 <0.03   BNP: BNP (last 3 results) No results for input(s): BNP in the last 8760 hours.  ProBNP (last 3 results) No results for input(s): PROBNP in the last 8760 hours.  CBG:  Recent Labs Lab 10/14/14 0809 10/14/14 1139 10/14/14 1713 10/14/14 2151 10/15/14 0752  GLUCAP 97 95 100* 92 112*       Signed:  Mairyn Lenahan  [facility] 10/15/2014, 10:42 AM

## 2014-10-29 ENCOUNTER — Ambulatory Visit (INDEPENDENT_AMBULATORY_CARE_PROVIDER_SITE_OTHER): Payer: BC Managed Care – PPO

## 2014-10-29 DIAGNOSIS — R55 Syncope and collapse: Secondary | ICD-10-CM | POA: Diagnosis not present

## 2014-11-16 ENCOUNTER — Other Ambulatory Visit: Payer: Self-pay

## 2014-12-09 ENCOUNTER — Telehealth: Payer: Self-pay | Admitting: *Deleted

## 2014-12-09 NOTE — Telephone Encounter (Signed)
Spoke to patient. Result given . Verbalized understanding  

## 2014-12-09 NOTE — Telephone Encounter (Signed)
-----   Message from Allayne ButcherBrittainy M Simmons, [place]-C sent at 12/04/2014  9:10 AM EDT ----- NSR Normal event monitor. No arrhythmia. Please notify patient of findings.

## 2016-03-06 ENCOUNTER — Emergency Department (HOSPITAL_COMMUNITY)
Admission: EM | Admit: 2016-03-06 | Discharge: 2016-03-06 | Disposition: A | Discharge status: Home / Self Care | Payer: BC Managed Care – PPO | Attending: Emergency Medicine | Admitting: Emergency Medicine

## 2016-03-06 ENCOUNTER — Emergency Department (HOSPITAL_COMMUNITY): Payer: BC Managed Care – PPO

## 2016-03-06 ENCOUNTER — Encounter (HOSPITAL_COMMUNITY): Payer: Self-pay

## 2016-03-06 DIAGNOSIS — I10 Essential (primary) hypertension: Secondary | ICD-10-CM | POA: Diagnosis not present

## 2016-03-06 DIAGNOSIS — R112 Nausea with vomiting, unspecified: Secondary | ICD-10-CM

## 2016-03-06 DIAGNOSIS — E119 Type 2 diabetes mellitus without complications: Secondary | ICD-10-CM | POA: Diagnosis not present

## 2016-03-06 DIAGNOSIS — R55 Syncope and collapse: Secondary | ICD-10-CM | POA: Insufficient documentation

## 2016-03-06 HISTORY — DX: Gastro-esophageal reflux disease without esophagitis: K21.9

## 2016-03-06 LAB — COMPREHENSIVE METABOLIC PANEL
ALT: 15 U/L (ref 14–54)
AST: 19 U/L (ref 15–41)
Albumin: 3.9 g/dL (ref 3.5–5.0)
Alkaline Phosphatase: 121 U/L (ref 38–126)
Anion gap: 9 (ref 5–15)
BUN: 12 mg/dL (ref 6–20)
CHLORIDE: 105 mmol/L (ref 101–111)
CO2: 28 mmol/L (ref 22–32)
Calcium: 9.7 mg/dL (ref 8.9–10.3)
Creatinine, Ser: 1.16 mg/dL — ABNORMAL HIGH (ref 0.44–1.00)
GFR, EST AFRICAN AMERICAN: 56 mL/min — AB (ref >60)
GFR, EST NON AFRICAN AMERICAN: 48 mL/min — AB (ref >60)
Glucose, Bld: 110 mg/dL — ABNORMAL HIGH (ref 65–99)
POTASSIUM: 3.9 mmol/L (ref 3.5–5.1)
Sodium: 142 mmol/L (ref 135–145)
Total Bilirubin: 0.9 mg/dL (ref 0.3–1.2)
Total Protein: 7.2 g/dL (ref 6.5–8.1)

## 2016-03-06 LAB — CBC WITH DIFFERENTIAL/PLATELET
Basophils Absolute: 0 10*3/uL (ref 0.0–0.1)
Basophils Relative: 0 %
EOS ABS: 0.2 10*3/uL (ref 0.0–0.7)
Eosinophils Relative: 2 %
HCT: 39.7 % (ref 36.0–46.0)
HEMOGLOBIN: 12.4 g/dL (ref 12.0–15.0)
Lymphocytes Relative: 30 %
Lymphs Abs: 2.6 10*3/uL (ref 0.7–4.0)
MCH: 25.9 pg — ABNORMAL LOW (ref 26.0–34.0)
MCHC: 31.2 g/dL (ref 30.0–36.0)
MCV: 83.1 fL (ref 78.0–100.0)
Monocytes Absolute: 0.6 10*3/uL (ref 0.1–1.0)
Monocytes Relative: 7 %
NEUTROS PCT: 61 %
Neutro Abs: 5.4 10*3/uL (ref 1.7–7.7)
PLATELETS: 263 10*3/uL (ref 150–400)
RBC: 4.78 MIL/uL (ref 3.87–5.11)
RDW: 14.7 % (ref 11.5–15.5)
WBC: 8.8 10*3/uL (ref 4.0–10.5)

## 2016-03-06 LAB — TROPONIN I

## 2016-03-06 LAB — LIPASE, BLOOD: LIPASE: 49 U/L (ref 11–51)

## 2016-03-06 MED ORDER — SUMATRIPTAN 20 MG/ACT NA SOLN
20.0000 mg | Freq: Once | NASAL | Status: DC
  Filled 2016-03-06 (×2): qty 1

## 2016-03-06 MED ORDER — SODIUM CHLORIDE 0.9 % IV BOLUS (SEPSIS)
1000.0000 mL | Freq: Once | INTRAVENOUS | Status: AC
  Administered 2016-03-06: 1000 mL via INTRAVENOUS

## 2016-03-06 MED ORDER — ONDANSETRON HCL 4 MG PO TABS: 4.0000 mg | ORAL_TABLET | Freq: Four times a day (QID) | ORAL | 0 refills | Status: DC | PRN

## 2016-03-06 MED ORDER — PROCHLORPERAZINE EDISYLATE 5 MG/ML IJ SOLN
10.0000 mg | Freq: Once | INTRAMUSCULAR | Status: AC
  Administered 2016-03-06: 10 mg via INTRAVENOUS
  Filled 2016-03-06: qty 2

## 2016-03-06 MED ORDER — ACETAMINOPHEN 325 MG PO TABS
650.0000 mg | ORAL_TABLET | Freq: Once | ORAL | Status: AC
  Administered 2016-03-06: 650 mg via ORAL
  Filled 2016-03-06: qty 2

## 2016-03-06 NOTE — ED Triage Notes (Signed)
Pt arrives in no distress walked to bathroom though was brought in for near syncopal episode with minimal dizziness now and a headache to the L lower side of her head

## 2016-03-06 NOTE — Discharge Planning (Signed)
Follow-up appointment needs? n/a  Transportation needs? n/a  Medication assistance needs? Pt prescribed Rx and has [employer] insurance coverage.  Equipment needs? n/a                                      Oxygen needs? n/a  H.H needs? n/a  [facility] chart for possible CM needs.  No needs identified.   Jolita Haefner J. Lucretia RoersWood, RN, BSN, [place]CM [phone]

## 2016-03-06 NOTE — ED Notes (Signed)
Patient transported to X-ray 

## 2016-03-06 NOTE — Discharge Instructions (Signed)

## 2016-03-06 NOTE — ED Provider Notes (Signed)
[facility]-EMERGENCY DEPT Provider Note   CSN: [id number] Arrival date & time: 03/06/16  1108     History   Chief Complaint Chief Complaint  Patient presents with  . Near Syncope    pt while at work became nauseas threw up and than had a near syncopal episode     HPI   Mercedes Riggs is a 65 y.o. female with past medical history significant for diabetes, hypertension and migraine complaining of nausea onset yesterday, she went to work this morning she was in an elevator she felt nauseous and like she was going to pass out, she vomited in the elevator and was eased to the ground because she felt so lightheaded. She states that she felt like she had have a bowel movement but when she went to the restroom she did not defecate. Positive sick contact at home and that her 54-year-old family member also has nausea vomiting. She denies any abdominal pain, change in urination, chest pain, shortness of breath, palpitations, increasing peripheral edema, calf pain, leg swelling. She endorses a right occipital headache which is atypical exacerbation of her chronic migraine. She rates her pain at 6 out of 10. Also endorses dry cough onset several days ago. Brought in by EMS and given antinausea medication, she states that she is hungry now and would like to.  Past Medical History:  Diagnosis Date  . Diabetes mellitus without complication (HCC)   . GERD (gastroesophageal reflux disease)   . Hypertension   . Migraines     Patient Active Problem List   Diagnosis Date Noted  . Syncope 10/13/2014  . HTN (hypertension) 10/13/2014  . Diabetes mellitus (HCC) 10/13/2014    Past Surgical History:  Procedure Laterality Date  . ABDOMINAL HYSTERECTOMY    . LUNG REMOVAL, PARTIAL      OB History    No data available       Home Medications    Prior to Admission medications   Medication Sig Start Date End Date Taking? Authorizing Provider  Clobetasol Propionate (TEMOVATE) 0.05 % external spray Apply  topically as directed. 10/09/14   Historical Provider, MD  DULoxetine (CYMBALTA) 60 MG capsule Take 60 mg by mouth daily. 10/05/14   Historical Provider, MD  labetalol (NORMODYNE) 300 MG tablet Take 300 mg by mouth 2 (two) times daily. 10/12/14   Historical Provider, MD  Misc. Devices (HEART RATE MONITOR) MISC 1 Units by Does not apply route as directed. 10/15/14   Brittainy M Simmons, PA-C  ondansetron (ZOFRAN) 4 MG tablet Take 1 tablet (4 mg total) by mouth every 6 (six) hours as needed for nausea or vomiting. 03/06/16   Kwamaine Cuppett, PA-C  SUMAtriptan (IMITREX) 50 MG tablet Take 1 tablet by mouth once as needed. Migraine.  May repeat every 2 hours if no relief.  Max 4 tabs/24 hours. 07/14/14   Historical Provider, MD  Allene Dillon [phone] MG/15ML SOLN Take by mouth as directed. 09/23/14   Historical Provider, MD  VENTOLIN HFA 108 (90 BASE) MCG/ACT inhaler Inhale 2 puffs into the lungs every 4 (four) hours as needed for wheezing or shortness of breath.  09/24/14   Historical Provider, MD    Family History No family history on file.  Social History Social History  Substance Use Topics  . Smoking status: Never Smoker  . Smokeless tobacco: Never Used  . Alcohol use Yes     Comment: glass wine/night     Allergies   Codeine; Percocet [oxycodone-acetaminophen]; and Tramadol   Review  of Systems Review of Systems  10 systems reviewed and found to be negative, except as noted in the HPI.   Physical Exam Updated Vital Signs BP 150/85   Pulse 62   Temp 97.8 F (36.6 C) (Oral)   Resp 17   Ht 5\' 2"  (1.575 m)   Wt 68 kg   SpO2 95%   BMI 27.44 kg/m   Physical Exam  Constitutional: She is oriented to person, place, and time. She appears well-developed and well-nourished. No distress.  HENT:  Head: Normocephalic and atraumatic.  Mouth/Throat: Oropharynx is clear and moist.  Eyes: Conjunctivae and EOM are normal. Pupils are equal, round, and reactive to light.  Neck: Normal range of  motion.  Full range of motion to neck  Cardiovascular: Normal rate, regular rhythm and intact distal pulses.   Pulmonary/Chest: Effort normal and breath sounds normal.  Abdominal: Soft. There is no tenderness.  Musculoskeletal: Normal range of motion.  Neurological: She is alert and oriented to person, place, and time.  Follows commands, Clear, goal oriented speech, Strength is 5 out of 5x4 extremities, patient ambulates with a coordinated in nonantalgic gait. Sensation is grossly intact.   Skin: She is not diaphoretic.  Psychiatric: She has a normal mood and affect.  Nursing note and vitals reviewed.    ED Treatments / Results  Labs (all labs ordered are listed, but only abnormal results are displayed) Labs Reviewed  CBC WITH DIFFERENTIAL/PLATELET - Abnormal; Notable for the following:       Result Value   MCH 25.9 (*)    All other components within normal limits  COMPREHENSIVE METABOLIC PANEL - Abnormal; Notable for the following:    Glucose, Bld 110 (*)    Creatinine, Ser 1.16 (*)    GFR calc non Af Amer 48 (*)    GFR calc Af Amer 56 (*)    All other components within normal limits  LIPASE, BLOOD  TROPONIN I    EKG  EKG Interpretation  Date/Time:  Monday March 06 2016 11:18:40 EDT Ventricular Rate:  66 PR Interval:    QRS Duration: 99 QT Interval:  428 QTC Calculation: 449 R Axis:   78 Text Interpretation:  Sinus rhythm Borderline repolarization abnormality Confirmed by Rubin PayorPICKERING  MD, Harrold DonathNATHAN [phone]([id number]) on 03/06/2016 1:47:14 PM       Radiology Dg Chest 2 View  Result Date: 03/06/2016 CLINICAL DATA:  65 year old female with a near syncopal episode. Diabetes. Hypertension. Initial encounter. EXAM: CHEST  2 VIEW COMPARISON:  08/05/2015 and 10/13/2014 chest x-ray. FINDINGS: No infiltrate, congestive heart failure or pneumothorax. Calcified slightly tortuous aorta. Heart size within normal limits. Mild mediastinal shift to left.  Question prior left lung surgery. No  plain film evidence of pulmonary malignancy. No acute bony abnormality. IMPRESSION: No active cardiopulmonary disease. Aortic atherosclerosis. Electronically Signed   By: Lacy DuverneySteven  Olson M.D.   On: 03/06/2016 12:31    Procedures Procedures (including critical care time)  Medications Ordered in ED Medications  SUMAtriptan (IMITREX) nasal spray 20 mg (not administered)  prochlorperazine (COMPAZINE) injection 10 mg (10 mg Intravenous Given 03/06/16 1211)  sodium chloride 0.9 % bolus 1,000 mL (1,000 mLs Intravenous New Bag/Given 03/06/16 1210)  acetaminophen (TYLENOL) tablet 650 mg (650 mg Oral Given 03/06/16 1240)     Initial Impression / Assessment and Plan / ED Course  I have reviewed the triage vital signs and the nursing notes.  Pertinent labs & imaging results that were available during my care of the patient were reviewed  by me and considered in my medical decision making (see chart for details).  Clinical Course    Vitals:   03/06/16 1200 03/06/16 1241 03/06/16 1245 03/06/16 1315  BP: 149/94 180/95 167/89 150/85  Pulse: 68 67 63 62  Resp: 22 18 18 17   Temp:      TempSrc:      SpO2: 100% 96% 95% 95%  Weight:      Height:        Medications  SUMAtriptan (IMITREX) nasal spray 20 mg (not administered)  prochlorperazine (COMPAZINE) injection 10 mg (10 mg Intravenous Given 03/06/16 1211)  sodium chloride 0.9 % bolus 1,000 mL (1,000 mLs Intravenous New Bag/Given 03/06/16 1210)  acetaminophen (TYLENOL) tablet 650 mg (650 mg Oral Given 03/06/16 1240)    Mercedes Riggs is 65 y.o. female presenting with Nausea and lightheaded sensation associated with vomiting this a.m. Likely vasovagal, abdominal exam is benign, she has a sick contact in that her ears 57-year-old relative also has not vomiting diarrhea. Abdominal exam is benign, patient afebrile overall quite well-appearing. Will check EKG, basic blood work, by mouth challenge and as she is endorsing cough chest x-ray. She is  reporting exacerbation of her chronic migraine, no change from typical, neurologic exam is nonfocal, no meningeal signs.  Blood work reassuring with no acute abnormality, EKG without acute changes, patient is tolerating by mouth's without issue, repeat neuro and abdominal exam unchanged. Patient is quite tired, I think the Compazine affected her significantly, her sister states that she has sleep apnea, this is likely having her to have a strong reaction to the Compazine. Sister will escort her home.  Evaluation does not show pathology that would require ongoing emergent intervention or inpatient treatment. Pt is hemodynamically stable and mentating appropriately. Discussed findings and plan with patient/guardian, who agrees with care plan. All questions answered. Return precautions discussed and outpatient follow up given.      Final Clinical Impressions(s) / ED Diagnoses   Final diagnoses:  Near syncope  Nausea and vomiting, intractability of vomiting not specified, unspecified vomiting type    New Prescriptions New Prescriptions   ONDANSETRON (ZOFRAN) 4 MG TABLET    Take 1 tablet (4 mg total) by mouth every 6 (six) hours as needed for nausea or vomiting.     Wynetta Emery, PA-C 03/06/16 1415    Benjiman Core, MD 03/06/16 [phone]

## 2016-10-25 IMAGING — CT CT HEAD W/O CM
2 series · 17 of 30 positions shown, 20 images · non-contrast
Comparison: 09/26/2004.

CLINICAL DATA: Making coffee at work and became dizzy and
nauseated.

EXAM:
CT HEAD WITHOUT CONTRAST
TECHNIQUE: Contiguous axial images were obtained from the base of the skull
through the vertex without intravenous contrast.

[Series 2: head w/o · axial · non-contrast · 0.45mm/px · z∈[-167,-52]mm · 9 of 29 slices shown, 12 images]
[im 3/29  brain]
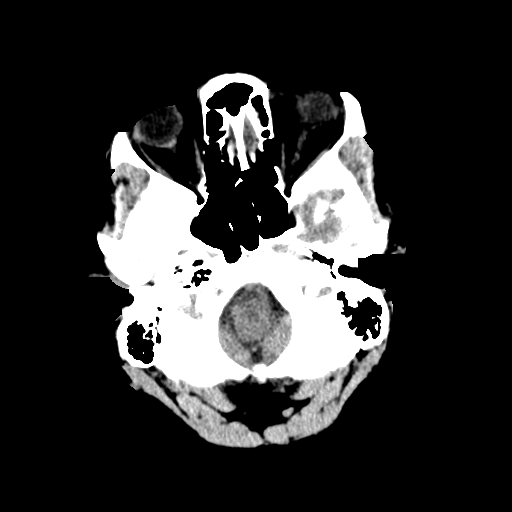
[im 3/29  bone]
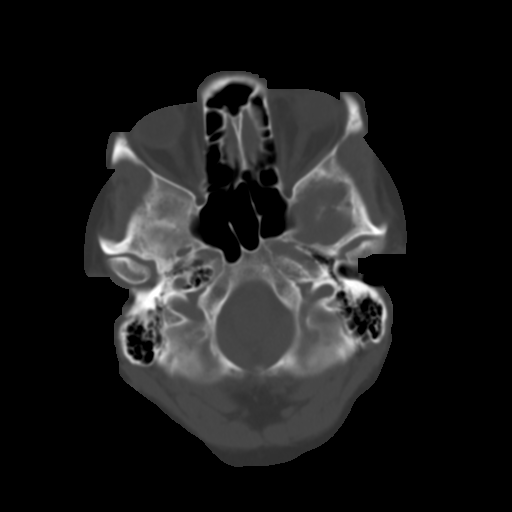
[im 6/29  brain]
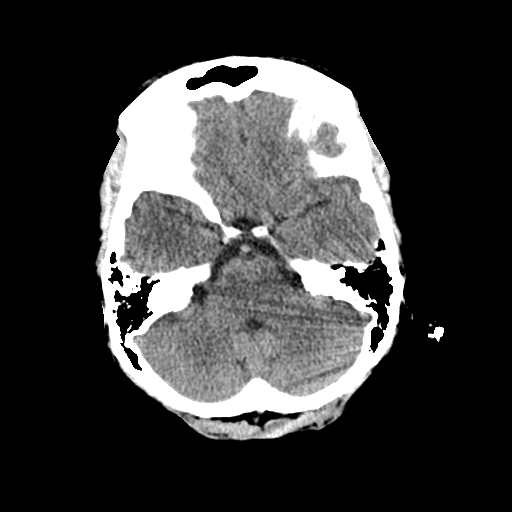
[im 9/29  brain]
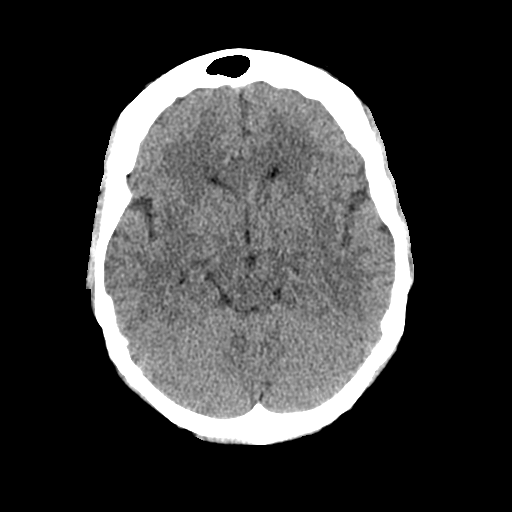
[im 12/29  brain]
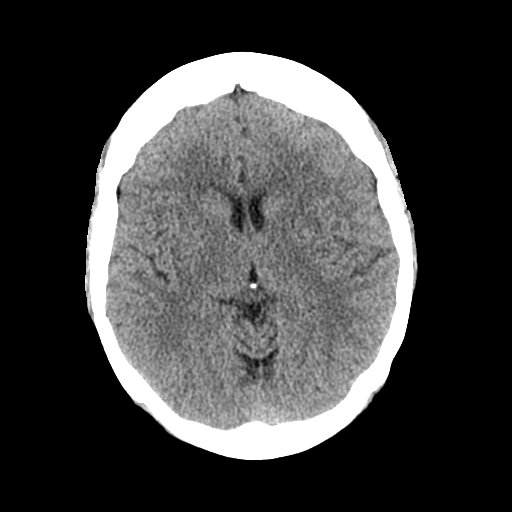
[im 15/29  brain]
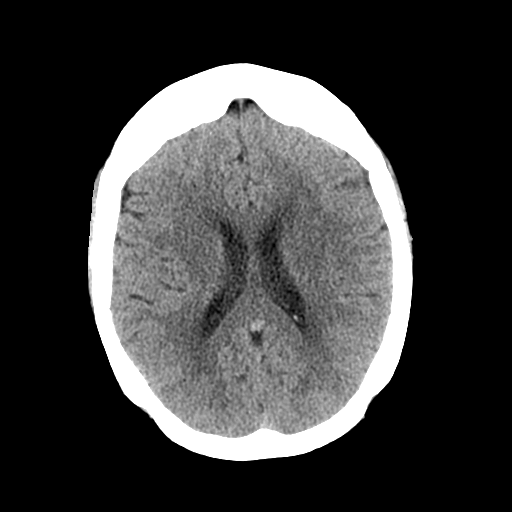
[im 15/29  bone]
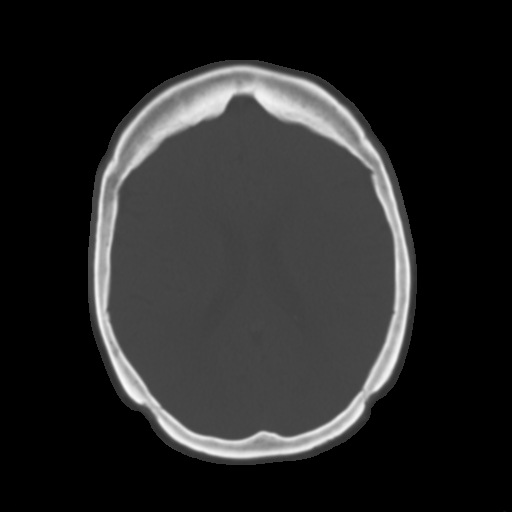
[im 17/29  brain]
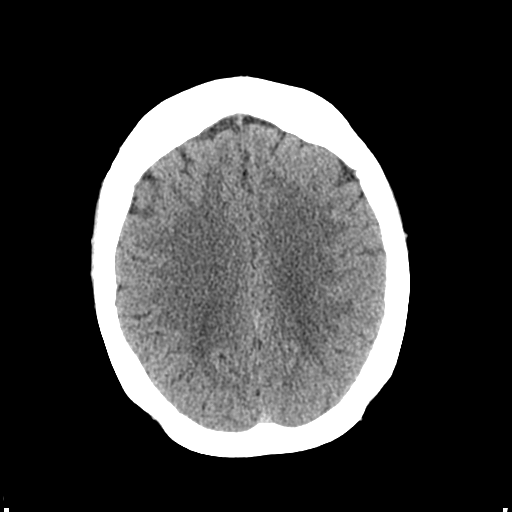
[im 20/29  brain]
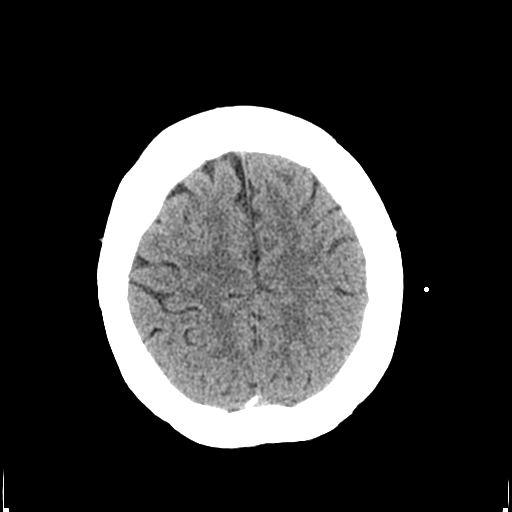
[im 23/29  brain]
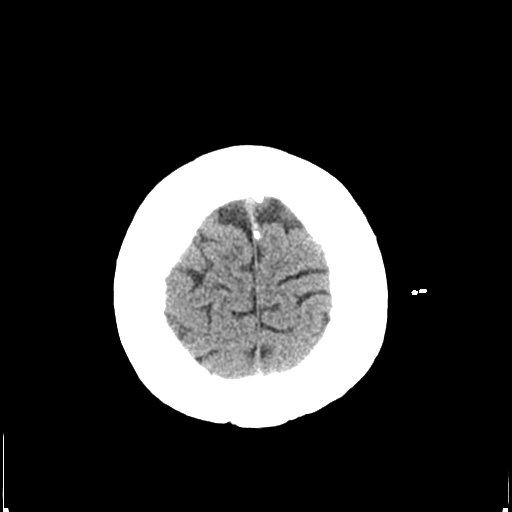
[im 26/29  brain]
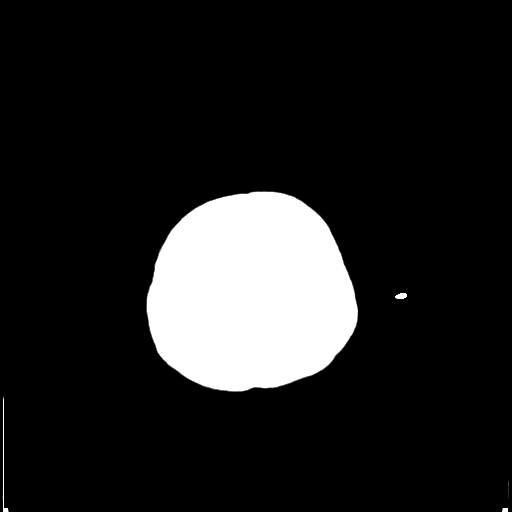
[im 26/29  bone]
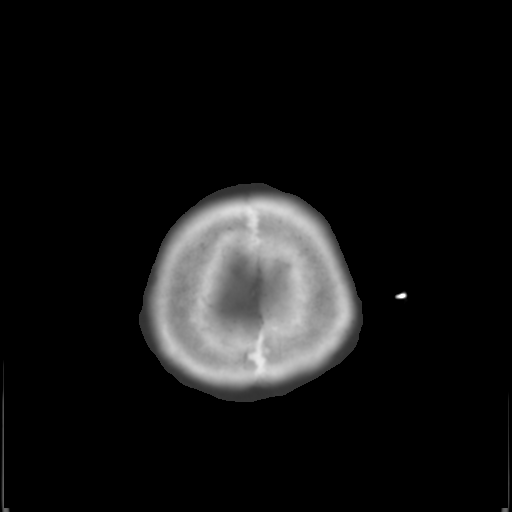

[Series 3: bone windows · axial · 0.45mm/px · z∈[-162,-54]mm · 8 of 48 slices shown]
[im 6/48  bone]
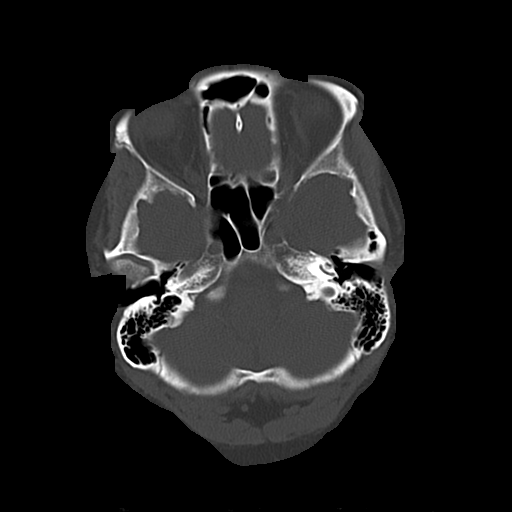
[im 11/48  bone]
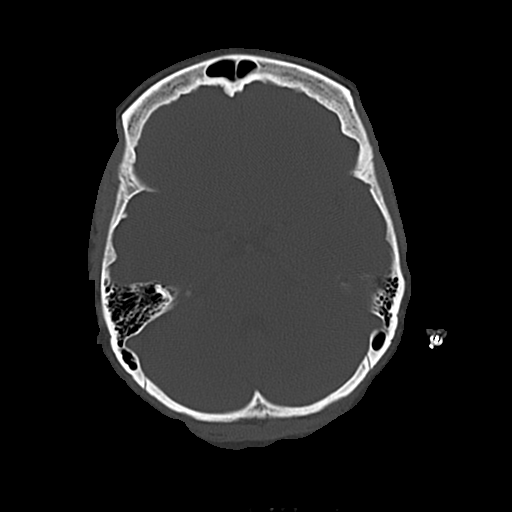
[im 16/48  bone]
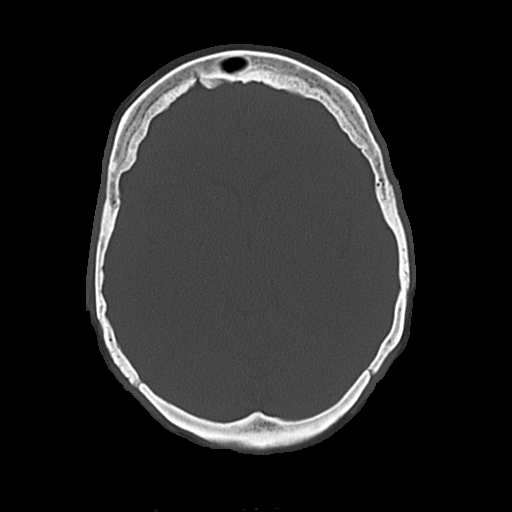
[im 21/48  bone]
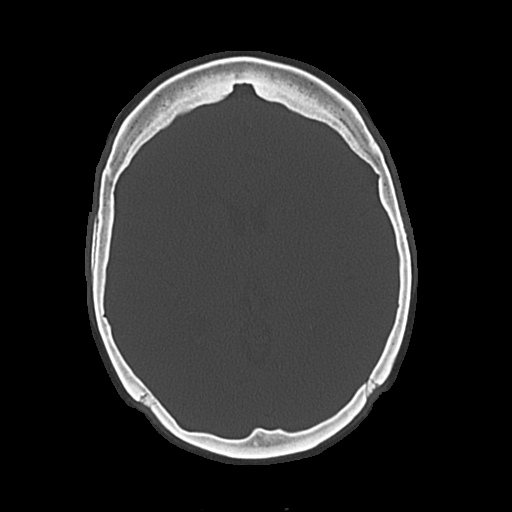
[im 27/48  bone]
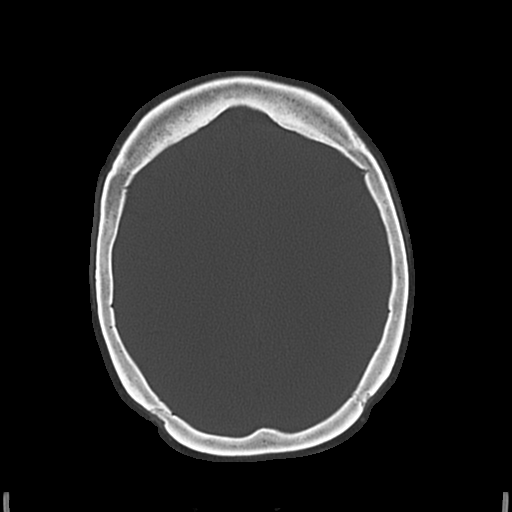
[im 32/48  bone]
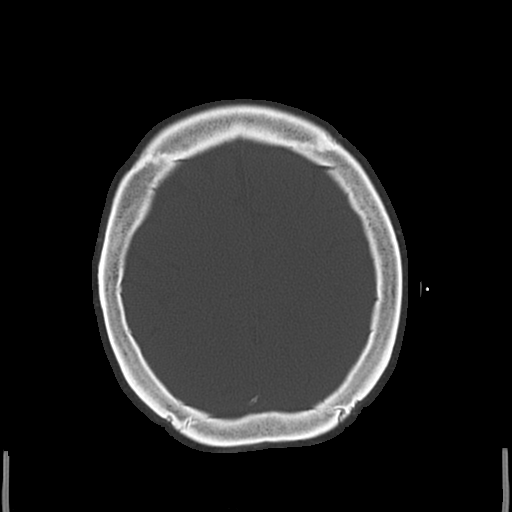
[im 37/48  bone]
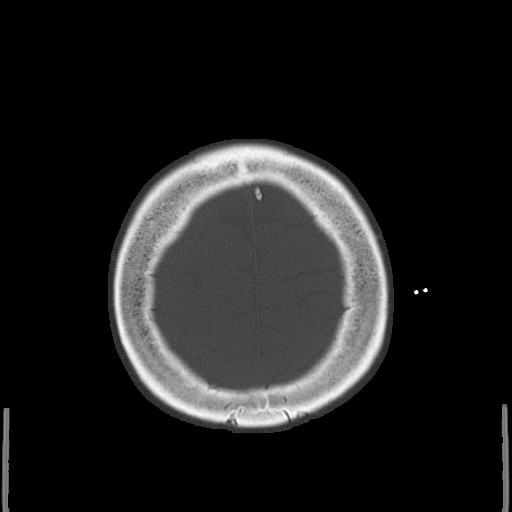
[im 42/48  bone]
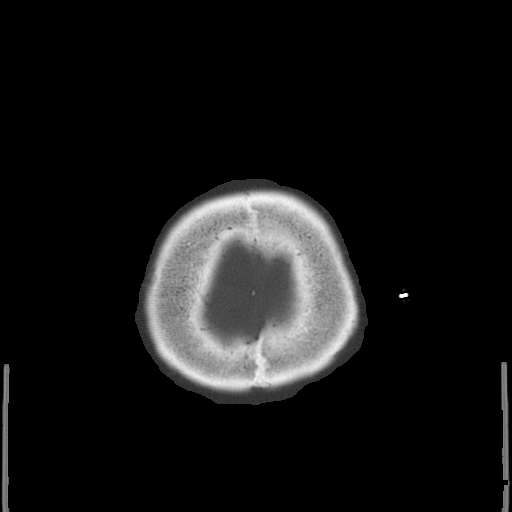

[17 of 30 positions shown; findings below may reference images not displayed]

FINDINGS: No evidence for acute infarction, hemorrhage, mass lesion,
hydrocephalus, or extra-axial fluid. No atrophy or white matter
disease. Intact calvarium. No acute sinus or mastoid disease.
Similar appearance to priors.
IMPRESSION: Negative exam.

## 2016-12-09 ENCOUNTER — Emergency Department (HOSPITAL_BASED_OUTPATIENT_CLINIC_OR_DEPARTMENT_OTHER): Payer: BC Managed Care – PPO

## 2016-12-09 ENCOUNTER — Observation Stay (HOSPITAL_BASED_OUTPATIENT_CLINIC_OR_DEPARTMENT_OTHER)
Admission: EM | Admit: 2016-12-09 | Discharge: 2016-12-10 | Disposition: A | Discharge status: Home / Self Care | Payer: BC Managed Care – PPO | Attending: Family Medicine | Admitting: Family Medicine

## 2016-12-09 ENCOUNTER — Encounter (HOSPITAL_BASED_OUTPATIENT_CLINIC_OR_DEPARTMENT_OTHER): Payer: Self-pay | Admitting: Emergency Medicine

## 2016-12-09 DIAGNOSIS — I10 Essential (primary) hypertension: Secondary | ICD-10-CM | POA: Diagnosis not present

## 2016-12-09 DIAGNOSIS — R600 Localized edema: Secondary | ICD-10-CM | POA: Insufficient documentation

## 2016-12-09 DIAGNOSIS — E119 Type 2 diabetes mellitus without complications: Secondary | ICD-10-CM

## 2016-12-09 DIAGNOSIS — Z79899 Other long term (current) drug therapy: Secondary | ICD-10-CM | POA: Insufficient documentation

## 2016-12-09 DIAGNOSIS — R079 Chest pain, unspecified: Secondary | ICD-10-CM | POA: Diagnosis not present

## 2016-12-09 DIAGNOSIS — R911 Solitary pulmonary nodule: Secondary | ICD-10-CM

## 2016-12-09 DIAGNOSIS — M7989 Other specified soft tissue disorders: Secondary | ICD-10-CM

## 2016-12-09 LAB — URINALYSIS, ROUTINE W REFLEX MICROSCOPIC
Bilirubin Urine: NEGATIVE
Glucose, UA: NEGATIVE mg/dL
HGB URINE DIPSTICK: NEGATIVE
Ketones, ur: NEGATIVE mg/dL
Leukocytes, UA: NEGATIVE
NITRITE: NEGATIVE
PROTEIN: NEGATIVE mg/dL
Specific Gravity, Urine: 1.024 (ref 1.005–1.030)
pH: 5.5 (ref 5.0–8.0)

## 2016-12-09 LAB — CBG MONITORING, ED: GLUCOSE-CAPILLARY: 165 mg/dL — AB (ref 65–99)

## 2016-12-09 LAB — BASIC METABOLIC PANEL
ANION GAP: 9 (ref 5–15)
BUN: 13 mg/dL (ref 6–20)
CALCIUM: 9.8 mg/dL (ref 8.9–10.3)
CO2: 30 mmol/L (ref 22–32)
CREATININE: 1.1 mg/dL — AB (ref 0.44–1.00)
Chloride: 103 mmol/L (ref 101–111)
GFR calc Af Amer: 59 mL/min — ABNORMAL LOW (ref >60)
GFR, EST NON AFRICAN AMERICAN: 51 mL/min — AB (ref >60)
Glucose, Bld: 165 mg/dL — ABNORMAL HIGH (ref 65–99)
Potassium: 3.4 mmol/L — ABNORMAL LOW (ref 3.5–5.1)
SODIUM: 142 mmol/L (ref 135–145)

## 2016-12-09 LAB — CBC
HCT: 39.1 % (ref 36.0–46.0)
Hemoglobin: 12.5 g/dL (ref 12.0–15.0)
MCH: 26.8 pg (ref 26.0–34.0)
MCHC: 32 g/dL (ref 30.0–36.0)
MCV: 83.7 fL (ref 78.0–100.0)
PLATELETS: 315 10*3/uL (ref 150–400)
RBC: 4.67 MIL/uL (ref 3.87–5.11)
RDW: 14.9 % (ref 11.5–15.5)
WBC: 10.9 10*3/uL — AB (ref 4.0–10.5)

## 2016-12-09 LAB — TROPONIN I

## 2016-12-09 LAB — BRAIN NATRIURETIC PEPTIDE: B NATRIURETIC PEPTIDE 5: 22.5 pg/mL (ref 0.0–100.0)

## 2016-12-09 LAB — D-DIMER, QUANTITATIVE: D-Dimer, Quant: 0.33 ug/mL-FEU (ref 0.00–0.50)

## 2016-12-09 MED ORDER — ONDANSETRON HCL 4 MG/2ML IJ SOLN
4.0000 mg | Freq: Once | INTRAMUSCULAR | Status: AC
  Administered 2016-12-09: 4 mg via INTRAVENOUS
  Filled 2016-12-09: qty 2

## 2016-12-09 MED ORDER — MORPHINE SULFATE (PF) 4 MG/ML IV SOLN
4.0000 mg | Freq: Once | INTRAVENOUS | Status: AC
  Administered 2016-12-09: 4 mg via INTRAVENOUS
  Filled 2016-12-09: qty 1

## 2016-12-09 MED ORDER — NITROGLYCERIN 0.4 MG SL SUBL
0.4000 mg | SUBLINGUAL_TABLET | SUBLINGUAL | Status: DC | PRN
  Filled 2016-12-09: qty 1

## 2016-12-09 NOTE — ED Triage Notes (Signed)
Patient states that she has had new onset dizziness at 1730 - patient also reports that she had chest pain soon after. Patinet reports that when she usually has this she has high BP - patient denies any SOB - reports that she has Headache and chest pressure

## 2016-12-09 NOTE — ED Provider Notes (Addendum)
[facility] Provider Note   CSN: [id number] Arrival date & time: 12/09/16  2049  By signing my name below, I, Deland PrettySherilynn Knight, attest that this documentation has been prepared under the direction and in the presence of Derwood KaplanNanavati, Edlyn Rosenburg, MD. Electronically Signed: Deland PrettySherilynn Knight, ED Scribe. 12/09/16. 9:38 PM.  History   Chief Complaint Chief Complaint  Patient presents with  . Dizziness   The history is provided by the patient. No language interpreter was used.   HPI Comments: Mercedes Riggs is a 66 y.o. female who presents to the Emergency Department complaining of left central "pressured" constant chest pain with associated leg swelling and nausea that began around 5:30pm today. Cough and pushing the area exacerbates her pain.  She also notes a dull "nagging" headache.  Although she denies diaphoresis with her current symptoms, the pt states that when she was in [place] on 12/03/2016  she had similar chest problems accompanied with diaphoresis. She also notes groin pain where she was diagnosed with bursitis. The pt denies a h/x of heart problems including MI, PE, DVT, and CHF. She does have a h/x of well controlled DM, GERD, and HTN. She denies SOB, diaphoresis, dyspnea on exertion  Past Medical History:  Diagnosis Date  . Diabetes mellitus without complication (HCC)   . GERD (gastroesophageal reflux disease)   . Hypertension   . Migraines     Patient Active Problem List   Diagnosis Date Noted  . Chest pain 12/09/2016  . Syncope 10/13/2014  . HTN (hypertension) 10/13/2014  . Diabetes mellitus (HCC) 10/13/2014    Past Surgical History:  Procedure Laterality Date  . ABDOMINAL HYSTERECTOMY    . LUNG REMOVAL, PARTIAL      OB History    No data available       Home Medications    Prior to Admission medications   Medication Sig Start Date End Date Taking? Authorizing Provider  Clobetasol Propionate (TEMOVATE) 0.05 % external spray Apply topically as  directed. 10/09/14   [provider]  DULoxetine (CYMBALTA) 60 MG capsule Take 60 mg by mouth daily. 10/05/14   [provider]  labetalol (NORMODYNE) 300 MG tablet Take 300 mg by mouth 2 (two) times daily. 10/12/14   [provider]  Misc. Devices (HEART RATE MONITOR) MISC 1 Units by Does not apply route as directed. 10/15/14   Robbie LisSimmons, Brittainy M, PA-C  ondansetron (ZOFRAN) 4 MG tablet Take 1 tablet (4 mg total) by mouth every 6 (six) hours as needed for nausea or vomiting. 03/06/16   Pisciotta, Joni ReiningNicole, PA-C  SUMAtriptan (IMITREX) 50 MG tablet Take 1 tablet by mouth once as needed. Migraine.  May repeat every 2 hours if no relief.  Max 4 tabs/24 hours. 07/14/14   [provider]  Allene DillonVANATOL LQ [phone] [id number] MG/15ML SOLN Take by mouth as directed. 09/23/14   [provider]  VENTOLIN HFA 108 (90 BASE) MCG/ACT inhaler Inhale 2 puffs into the lungs every 4 (four) hours as needed for wheezing or shortness of breath.  09/24/14   [provider]    Family History History reviewed. No pertinent family history.  Social History Social History  Substance Use Topics  . Smoking status: Never Smoker  . Smokeless tobacco: Never Used  . Alcohol use Yes     Comment: glass wine/night     Allergies   Codeine; Percocet [oxycodone-acetaminophen]; and Tramadol   Review of Systems Review of Systems  Cardiovascular: Positive for chest pain and leg swelling.  Gastrointestinal:  Positive for nausea.     Physical Exam Updated Vital Signs BP (!) 163/97   Pulse 61   Resp (!) 25   Ht 5\' 3"  (1.6 m)   Wt 81.6 kg (180 lb)   SpO2 100%   BMI 31.89 kg/m   Physical Exam  Constitutional: She is oriented to person, place, and time. She appears well-developed and well-nourished.  HENT:  Head: Normocephalic.  Eyes: EOM are normal.  Neck: Normal range of motion.  Cardiovascular: Normal rate, regular rhythm and normal heart sounds.  Exam reveals no gallop and no  friction rub.   No murmur heard. Pulses:      Radial pulses are 2+ on the right side, and 2+ on the left side.       Femoral pulses are 2+ on the right side, and 2+ on the left side. Pulmonary/Chest: Effort normal. No respiratory distress. She has no wheezes. She has no rales. She exhibits tenderness (with palpation).  Abdominal: She exhibits no distension.  Musculoskeletal: Normal range of motion.  Neurological: She is alert and oriented to person, place, and time.  Strength and sensation equal and intact bilaterally throughout the upper and lower extremities.Normal gait. Coordination intact.  Motor and local extremities normal.   Psychiatric: She has a normal mood and affect.  Nursing note and vitals reviewed.    ED Treatments / Results   DIAGNOSTIC STUDIES: Oxygen Saturation is 98% on RA, normal by my interpretation.   COORDINATION OF CARE: 9:25 PM-Discussed next steps with pt. Pt verbalized understanding and is agreeable with the plan.   Labs (all labs ordered are listed, but only abnormal results are displayed) Labs Reviewed  BASIC METABOLIC PANEL - Abnormal; Notable for the following:       Result Value   Potassium 3.4 (*)    Glucose, Bld 165 (*)    Creatinine, Ser 1.10 (*)    GFR calc non Af Amer 51 (*)    GFR calc Af Amer 59 (*)    All other components within normal limits  CBC - Abnormal; Notable for the following:    WBC 10.9 (*)    All other components within normal limits  CBG MONITORING, ED - Abnormal; Notable for the following:    Glucose-Capillary 165 (*)    All other components within normal limits  URINALYSIS, ROUTINE W REFLEX MICROSCOPIC  TROPONIN I  D-DIMER, QUANTITATIVE (NOT AT [facility])  BRAIN NATRIURETIC PEPTIDE    EKG  EKG Interpretation  Date/Time:  Saturday December 09 2016 20:57:39 EDT Ventricular Rate:  88 PR Interval:    QRS Duration: 98 QT Interval:  388 QTC Calculation: 470 R Axis:   78 Text Interpretation:  Sinus rhythm Nonspecific T  abnrm, anterolateral leads No acute changes No significant change since last tracing Confirmed by Derwood Kaplan [phone]) on 12/09/2016 11:05:15 PM       Radiology Dg Chest 2 View  Result Date: 12/09/2016 CLINICAL DATA:  Chest pain. EXAM: CHEST  2 VIEW COMPARISON:  03/06/2016 FINDINGS: The lungs are clear without focal pneumonia, edema, pneumothorax or pleural effusion. Small pulmonary nodule identified in the left suprahilar region. The cardiopericardial silhouette is within normal limits for size. The visualized bony structures of the thorax are intact. Telemetry leads overlie the chest. IMPRESSION: Pulmonary nodule identified left suprahilar region, not definitely present on prior studies. CT chest without contrast recommended to further evaluate. Otherwise no focal airspace consolidation or pulmonary edema. No pleural effusion. Electronically Signed   By: Kennith Center  M.D.   On: 12/09/2016 21:27    Procedures Procedures (including critical care time)  Medications Ordered in ED Medications  nitroGLYCERIN (NITROSTAT) SL tablet 0.4 mg (0.4 mg Sublingual Refused 12/09/16 2245)  aspirin chewable tablet 324 mg (not administered)  morphine 4 MG/ML injection 4 mg (4 mg Intravenous Given 12/09/16 2245)  ondansetron (ZOFRAN) injection 4 mg (4 mg Intravenous Given 12/09/16 2245)     Initial Impression / Assessment and Plan / ED Course  I have reviewed the triage vital signs and the nursing notes.  Pertinent labs & imaging results that were available during my care of the patient were reviewed by me and considered in my medical decision making (see chart for details).  Clinical Course as of Dec 11 11  Sun Dec 10, 2016  0012 Pt is chest pain free. Status changed to telemetry from stepdown.  [AN]    Clinical Course User Index [AN] Derwood Kaplan, MD    Patient is without high-risk features of headache including: Sudden onset/thunderclap HA, No similar headache in past, Altered mental  status, Accompanying seizure, Headache with exertion, Age > 50, History of immunocompromise, Neck or shoulder pain, Fever, Use of anticoagulation, Family history of spontaneous SAH, Concomitant drug use, Toxic exposure.  Patient has a normal complete neurological exam, normal vital signs, normal level of consciousness, no signs of meningismus, is well-appearing/non-toxic appearing, no signs of trauma. No papilledema, no pain over the temporal arteries. Imaging with CT/MRI not indicated given history and physical exam findings. No dangerous or life-threatening conditions suspected or identified by history, physical exam, and by work-up. No indications for hospitalization identified.   Pt is having chest pain that is concerning . Chest pain itself is non specific, but pt has associated dib, nausea and sweats. PT also reports isolated chest pain 2 weeks ago whilst in [place] with sweats.  She has multiple risk factors for ACS - DM/HTN/HL/obesity. Pt's ekg is  Reassuring. We would have sent pt home if it weren't for the continuous chest pain and diaphoresis.  We will screen patient for PE. Dimer ordered.   Final Clinical Impressions(s) / ED Diagnoses   Final diagnoses:  Chest pain, unspecified type  Leg swelling    New Prescriptions New Prescriptions   No medications on file   I personally performed the services described in this documentation, which was scribed in my presence. The recorded information has been reviewed and is accurate.     Derwood Kaplan, MD 12/09/16 1610    Derwood Kaplan, MD 12/10/16 [id number]

## 2016-12-10 ENCOUNTER — Observation Stay (HOSPITAL_COMMUNITY): Payer: BC Managed Care – PPO

## 2016-12-10 ENCOUNTER — Encounter (HOSPITAL_COMMUNITY): Payer: Self-pay | Admitting: Internal Medicine

## 2016-12-10 ENCOUNTER — Other Ambulatory Visit: Payer: Self-pay | Admitting: Student

## 2016-12-10 DIAGNOSIS — R072 Precordial pain: Secondary | ICD-10-CM

## 2016-12-10 DIAGNOSIS — I10 Essential (primary) hypertension: Secondary | ICD-10-CM | POA: Diagnosis not present

## 2016-12-10 DIAGNOSIS — R079 Chest pain, unspecified: Secondary | ICD-10-CM | POA: Diagnosis not present

## 2016-12-10 DIAGNOSIS — E119 Type 2 diabetes mellitus without complications: Secondary | ICD-10-CM | POA: Diagnosis not present

## 2016-12-10 DIAGNOSIS — R600 Localized edema: Secondary | ICD-10-CM | POA: Diagnosis not present

## 2016-12-10 DIAGNOSIS — E118 Type 2 diabetes mellitus with unspecified complications: Secondary | ICD-10-CM

## 2016-12-10 LAB — GLUCOSE, CAPILLARY
GLUCOSE-CAPILLARY: 74 mg/dL (ref 65–99)
GLUCOSE-CAPILLARY: 86 mg/dL (ref 65–99)

## 2016-12-10 LAB — CBC
HEMATOCRIT: 38.3 % (ref 36.0–46.0)
Hemoglobin: 11.9 g/dL — ABNORMAL LOW (ref 12.0–15.0)
MCH: 25.9 pg — ABNORMAL LOW (ref 26.0–34.0)
MCHC: 31.1 g/dL (ref 30.0–36.0)
MCV: 83.4 fL (ref 78.0–100.0)
Platelets: 289 10*3/uL (ref 150–400)
RBC: 4.59 MIL/uL (ref 3.87–5.11)
RDW: 14.9 % (ref 11.5–15.5)
WBC: 10.4 10*3/uL (ref 4.0–10.5)

## 2016-12-10 LAB — CREATININE, SERUM
Creatinine, Ser: 1.08 mg/dL — ABNORMAL HIGH (ref 0.44–1.00)
GFR calc Af Amer: >60 mL/min (ref >60)
GFR calc non Af Amer: 52 mL/min — ABNORMAL LOW (ref >60)

## 2016-12-10 LAB — TROPONIN I: Troponin I: <0.03 ng/mL (ref <0.03)

## 2016-12-10 MED ORDER — HYDRALAZINE HCL 10 MG PO TABS: 10.0000 mg | ORAL_TABLET | Freq: Three times a day (TID) | ORAL | 0 refills | Status: AC

## 2016-12-10 MED ORDER — HYDRALAZINE HCL 20 MG/ML IJ SOLN: 10.0000 mg | INTRAMUSCULAR | Status: DC | PRN

## 2016-12-10 MED ORDER — ONDANSETRON HCL 4 MG/2ML IJ SOLN: 4.0000 mg | Freq: Four times a day (QID) | INTRAMUSCULAR | Status: DC | PRN

## 2016-12-10 MED ORDER — NITROGLYCERIN 0.4 MG SL SUBL: 0.4000 mg | SUBLINGUAL_TABLET | SUBLINGUAL | Status: DC | PRN

## 2016-12-10 MED ORDER — HYDROCHLOROTHIAZIDE 25 MG PO TABS
25.0000 mg | ORAL_TABLET | Freq: Every day | ORAL | Status: DC
  Administered 2016-12-10: 25 mg via ORAL
  Filled 2016-12-10: qty 1

## 2016-12-10 MED ORDER — LOSARTAN POTASSIUM 50 MG PO TABS
100.0000 mg | ORAL_TABLET | Freq: Every day | ORAL | Status: DC
  Administered 2016-12-10: 100 mg via ORAL
  Filled 2016-12-10: qty 2

## 2016-12-10 MED ORDER — INSULIN ASPART 100 UNIT/ML ~~LOC~~ SOLN: 0.0000 [IU] | Freq: Three times a day (TID) | SUBCUTANEOUS | Status: DC

## 2016-12-10 MED ORDER — ACETAMINOPHEN 325 MG PO TABS: 650.0000 mg | ORAL_TABLET | ORAL | Status: DC | PRN

## 2016-12-10 MED ORDER — ASPIRIN EC 325 MG PO TBEC
325.0000 mg | DELAYED_RELEASE_TABLET | Freq: Every day | ORAL | Status: DC
  Filled 2016-12-10: qty 1

## 2016-12-10 MED ORDER — MORPHINE SULFATE (PF) 2 MG/ML IV SOLN: 2.0000 mg | INTRAVENOUS | Status: DC | PRN

## 2016-12-10 MED ORDER — ENOXAPARIN SODIUM 40 MG/0.4ML ~~LOC~~ SOLN
40.0000 mg | SUBCUTANEOUS | Status: DC
  Administered 2016-12-10: 40 mg via SUBCUTANEOUS
  Filled 2016-12-10: qty 0.4

## 2016-12-10 MED ORDER — ASPIRIN 81 MG PO CHEW
324.0000 mg | CHEWABLE_TABLET | Freq: Once | ORAL | Status: DC
  Filled 2016-12-10: qty 4

## 2016-12-10 NOTE — Discharge Instructions (Signed)
Acute Pain, Adult  Acute pain is a type of pain that may last for just a few days or as long as six months. It is often related to an illness, injury, or medical procedure. Acute pain may be mild, moderate, or severe. It usually goes away once your injury has healed or you are no longer ill.  Pain can make it hard for you to do daily activities. It can cause anxiety and lead to other problems if left untreated. Treatment depends on the cause and severity of your acute pain.  Follow these instructions at home:   Check your pain level as told by your health care provider.   Take over-the-counter and prescription medicines only as told by your health care provider.   If you are taking prescription pain medicine:  ? Ask your health care provider about taking a stool softener or laxative to prevent constipation.  ? Do not stop taking the medicine suddenly. Talk to your health care provider about how and when to discontinue prescription pain medicine.  ? If your pain is severe, do not take more pills than instructed by your health care provider.  ? Do not take other over-the-counter pain medicines in addition to this medicine unless told by your health care provider.  ? Do not drive or operate heavy machinery while taking prescription pain medicine.   Apply ice or heat as told by your health care provider. These may reduce swelling and pain.   Ask your health care provider if other strategies such as distraction, relaxation, or physical therapies can help your pain.   Keep all follow-up visits as told by your health care provider. This is important.  Contact a health care provider if:   You have pain that is not controlled by medicine.   Your pain does not improve or gets worse.   You have side effects from pain medicines, such as vomitingor confusion.  Get help right away if:   You have severe pain.   You have trouble breathing.   You lose consciousness.   You have chest pain or pressure that lasts for more  than a few minutes. Along with the chest pain you may:  ? Have pain or discomfort in one or both arms, your back, neck, jaw, or stomach.  ? Have shortness of breath.  ? Break out in a cold sweat.  ? Feel nauseous.  ? Become light-headed.  These symptoms may represent a serious problem that is an emergency. Do not wait to see if the symptoms will go away. Get medical help right away. Call your local emergency services (911 in the [place] Do not drive yourself to the hospital.  This information is not intended to replace advice given to you by your health care provider. Make sure you discuss any questions you have with your health care provider.  Document Released: 05/23/2015 Document Revised: 10/15/2015 Document Reviewed: 05/23/2015  [employer] Interactive Patient Education  2018 [employer]

## 2016-12-10 NOTE — Discharge Summary (Signed)
Physician Discharge Summary  Mercedes Riggs MVH:[id number] DOB: [birth year] DOA: 12/09/2016  PCP: Elspeth Choerrell, Grace E., MD  Admit date: 12/09/2016 Discharge date: 12/10/2016  Admitted From: Home  Disposition: Home   Recommendations for Outpatient Follow-up:  1. Follow up with PCP in 1 weeks 2. Follow up with cardiology in 2 weeks, arrange for outpatient stress testing.   3. Please obtain BMP/CBC in one week 4. Please monitor BP and adjust medications as needed for better blood pressure control.   Discharge Condition: Stable   CODE STATUS: FuLL   Brief Hospitalization Summary: Please see all hospital notes, images, labs for full details of the hospitalization.  HPI: Mercedes ChapelWillene M Weedon is a 66 y.o. female with history of hypertension and diabetes mellitus presents to the ER with complaints of chest pain. Patient started developing chest pain last evening while having dinner at the restaurant. Pain was in the left side of the chest pressure-like nonradiating with no associated shortness of breath or diaphoresis. Patient states 2 weeks ago when patient was in [place] she had a similar pain at the time patient also had diaphoresis. This time patient chest pain persisted and patient came to the ER. Patient had mild nausea along with chest pain.   ED Course: In the ER cardiac markers, EKG and chest x-ray were unremarkable. Chest x-ray did show pulmonary nodule. Patient chest pain resolved with IV morphine. Blood pressure was elevated. Patient is being admitted for further management of chest pain. At my exam patient is chest pain-free.   1. Chest pain - with history of diabetes mellitus and hypertension will admit for observation to rule out ACS. Check cardiac markers. When necessary nitroglycerin. Consulted cardiology who recommended outpatient stress testing. Will have her follow up in 2 weeks with cardiology outpatient. They did not feel her symptoms were cardiac.  She had a repeat EKG with no acute  findings.   2. Hypertension - patient states she was recently placed on Cozaar and hydrochlorothiazide 3 months ago. Since blood pressure is elevated we will add oral hydralazine 10 mg TID, Follow blood pressure outpatient with PCP and cardiology for further management.  3. Diabetes mellitus type 2 on diet - resume home management.  4. History of GERD on PPI. 5. Elevated creatinine - last creatinine in care everywhere in 2015 was 1.1. Repeat creatinine 1.08. 6. Pulmonary nodule - CT chest done 7/22 - No evidence of pulmonary nodule. See below for full report.    UDS is pending. I have reviewed patient's old charts and labs.  DVT prophylaxis: Lovenox. Code Status: Full code.  Family Communication: Discussed with patient.  Disposition Plan: Home.  Consults called: None.  Admission status: Observation.   Discharge Diagnoses:  Principal Problem:   Chest pain Active Problems:   HTN (hypertension)   Diabetes mellitus (HCC)  Discharge Instructions: Discharge Instructions    Call MD for:  difficulty breathing, headache or visual disturbances    Complete by:  As directed    Call MD for:  extreme fatigue    Complete by:  As directed    Call MD for:  persistant dizziness or light-headedness    Complete by:  As directed    Call MD for:  severe uncontrolled pain    Complete by:  As directed    Call MD for:  temperature >100.4    Complete by:  As directed    Increase activity slowly    Complete by:  As directed      Allergies as  of 12/10/2016      Reactions   Aspirin Other (See Comments)   Other reaction(s): Bleeding (intolerance) Avoid due to brain hemorrhages   Codeine Itching   Percocet [oxycodone-acetaminophen] Other (See Comments)   Hallucinations   Prednisone    Other reaction(s): Other (See Comments) Insomnia   Propoxyphene Other (See Comments)   Made act crazy   Tramadol Rash      Medication List    STOP taking these medications   ondansetron 4 MG  tablet Commonly known as:  ZOFRAN     TAKE these medications   DRY EYES OP Place 1 drop into both eyes daily as needed (dry eyes).   hydrALAZINE 10 MG tablet Commonly known as:  APRESOLINE Take 1 tablet (10 mg total) by mouth 3 (three) times daily.   latanoprost 0.005 % ophthalmic solution Commonly known as:  XALATAN Place 1 drop into the right eye at bedtime.   losartan-hydrochlorothiazide 100-25 MG tablet Commonly known as:  HYZAAR Take 1 tablet by mouth daily.   omeprazole 20 MG capsule Commonly known as:  PRILOSEC Take 20 mg by mouth daily.   VENTOLIN HFA 108 (90 Base) MCG/ACT inhaler Generic drug:  albuterol Inhale 2 puffs into the lungs every 4 (four) hours as needed for wheezing or shortness of breath.      Follow-up Information    Elspeth Cho., MD. Schedule an appointment as soon as possible for a visit in 1 week(s).   Specialty:  Internal Medicine Why:  Hospital Follow Up  Contact information: [address] [place] [address] [phone]        Marisue Brooklyn, MD. Schedule an appointment as soon as possible for a visit in 1 week(s).   Contact information: [address] [place] [address] [phone]        Allayne Butcher, PA-C. Schedule an appointment as soon as possible for a visit in 2 week(s).   Specialties:  Cardiology, Radiology Why:  Hospital Follow UP  Contact information: [address] [place] [address] [phone]          Allergies  Allergen Reactions  . Aspirin Other (See Comments)    Other reaction(s): Bleeding (intolerance) Avoid due to brain hemorrhages  . Codeine Itching  . Percocet [Oxycodone-Acetaminophen] Other (See Comments)    Hallucinations  . Prednisone     Other reaction(s): Other (See Comments) Insomnia  . Propoxyphene Other (See Comments)    Made act crazy  . Tramadol Rash   Current Discharge Medication List    START taking these  medications   Details  hydrALAZINE (APRESOLINE) 10 MG tablet Take 1 tablet (10 mg total) by mouth 3 (three) times daily. Qty: 30 tablet, Refills: 0      CONTINUE these medications which have NOT CHANGED   Details  Artificial Tear Ointment (DRY EYES OP) Place 1 drop into both eyes daily as needed (dry eyes).    latanoprost (XALATAN) 0.005 % ophthalmic solution Place 1 drop into the right eye at bedtime.    losartan-hydrochlorothiazide (HYZAAR) 100-25 MG tablet Take 1 tablet by mouth daily.    omeprazole (PRILOSEC) 20 MG capsule Take 20 mg by mouth daily.    VENTOLIN HFA 108 (90 BASE) MCG/ACT inhaler Inhale 2 puffs into the lungs every 4 (four) hours as needed for wheezing or shortness of breath.  Refills: 11      STOP taking these medications     ondansetron (ZOFRAN)  4 MG tablet         Procedures/Studies: Dg Chest 2 View  Result Date: 12/09/2016 CLINICAL DATA:  Chest pain. EXAM: CHEST  2 VIEW COMPARISON:  03/06/2016 FINDINGS: The lungs are clear without focal pneumonia, edema, pneumothorax or pleural effusion. Small pulmonary nodule identified in the left suprahilar region. The cardiopericardial silhouette is within normal limits for size. The visualized bony structures of the thorax are intact. Telemetry leads overlie the chest. IMPRESSION: Pulmonary nodule identified left suprahilar region, not definitely present on prior studies. CT chest without contrast recommended to further evaluate. Otherwise no focal airspace consolidation or pulmonary edema. No pleural effusion. Electronically Signed   By: Kennith Center M.D.   On: 12/09/2016 21:27   Ct Chest Wo Contrast  Result Date: 12/10/2016 CLINICAL DATA:  Abnormal chest radiograph question lung nodule EXAM: CT CHEST WITHOUT CONTRAST TECHNIQUE: Multidetector CT imaging of the chest was performed following the standard protocol without IV contrast. Sagittal and coronal MPR images reconstructed from axial data set. COMPARISON:  Chest  radiograph 12/09/2016 FINDINGS: Cardiovascular: Atherosclerotic calcification aorta. Ascending thoracic aorta upper normal caliber 3.7 cm diameter. Mild atherosclerotic calcification aorta. Minimal coronary arterial calcification. No pericardial effusion. Mediastinum/Nodes: Esophagus unremarkable. Scattered calcified AP window and para- aortic nodes. No thoracic adenopathy. Base of cervical region unremarkable. Lungs/Pleura: Minimal subsegmental atelectasis RIGHT middle lobe. Dependent atelectasis LEFT lower lobe. Prior LEFT upper lobe resection with medial staple line. No acute infiltrate, pleural effusion or pneumothorax. No pulmonary nodules identified. Upper Abdomen: Calcified granuloma within spleen. Post cholecystectomy. Remaining visualized upper abdomen unremarkable. Musculoskeletal: Small bone island T1 vertebral body. No additional osseous abnormalities. IMPRESSION: No evidence of pulmonary nodule. Postsurgical changes LEFT upper lobe and scattered subsegmental atelectasis bilaterally. Old granulomatous disease. Aortic Atherosclerosis (ICD10-I70.0). Electronically Signed   By: Ulyses Southward M.D.   On: 12/10/2016 13:51      Subjective: Pt says she feels much better, no CP, no SOB, wants to go home.   Discharge Exam: Vitals:   12/10/16 0500 12/10/16 0932  BP: (!) 151/88 (!) 141/85  Pulse: 68   Resp: 20   Temp: 98.4 F (36.9 C)    Vitals:   12/09/16 2345 12/10/16 0116 12/10/16 0500 12/10/16 0932  BP:  (!) 158/94 (!) 151/88 (!) 141/85  Pulse: 61 62 68   Resp: (!) 25 20 20    Temp:  98.2 F (36.8 C) 98.4 F (36.9 C)   TempSrc:  Oral Oral   SpO2: 100% 99% 99%   Weight:  83.8 kg (184 lb 11.2 oz)    Height:  5\' 3"  (1.6 m)      General: Pt is alert, awake, not in acute distress Cardiovascular: RRR, S1/S2 +, no rubs, no gallops Respiratory: CTA bilaterally, no wheezing, no rhonchi Abdominal: Soft, NT, ND, bowel sounds + Extremities: no edema, no cyanosis   The results of significant  diagnostics from this hospitalization (including imaging, microbiology, ancillary and laboratory) are listed below for reference.     Microbiology: No results found for this or any previous visit (from the past 240 hour(s)).   Labs: BNP (last 3 results)  Recent Labs  12/09/16 2100  BNP 22.5   Basic Metabolic Panel:  Recent Labs Lab 12/09/16 2100 12/10/16 0255  NA 142  --   K 3.4*  --   CL 103  --   CO2 30  --   GLUCOSE 165*  --   BUN 13  --   CREATININE 1.10* 1.08*  CALCIUM 9.8  --  Liver Function Tests: No results for input(s): AST, ALT, ALKPHOS, BILITOT, PROT, ALBUMIN in the last 168 hours. No results for input(s): LIPASE, AMYLASE in the last 168 hours. No results for input(s): AMMONIA in the last 168 hours. CBC:  Recent Labs Lab 12/09/16 2100 12/10/16 0255  WBC 10.9* 10.4  HGB 12.5 11.9*  HCT 39.1 38.3  MCV 83.7 83.4  PLT 315 289   Cardiac Enzymes:  Recent Labs Lab 12/09/16 2100 12/10/16 0255 12/10/16 0716  TROPONINI <0.03 <0.03 <0.03   BNP: Invalid input(s): POCBNP CBG:  Recent Labs Lab 12/09/16 2101 12/10/16 0826 12/10/16 1106  GLUCAP 165* 74 86   D-Dimer  Recent Labs  12/09/16 2100  DDIMER 0.33   Hgb A1c No results for input(s): HGBA1C in the last 72 hours. Lipid Profile No results for input(s): CHOL, HDL, LDLCALC, TRIG, CHOLHDL, LDLDIRECT in the last 72 hours. Thyroid function studies No results for input(s): TSH, T4TOTAL, T3FREE, THYROIDAB in the last 72 hours.  Invalid input(s): FREET3 Anemia work up No results for input(s): VITAMINB12, FOLATE, FERRITIN, TIBC, IRON, RETICCTPCT in the last 72 hours. Urinalysis    Component Value Date/Time   COLORURINE YELLOW 12/09/2016 2145   APPEARANCEUR CLEAR 12/09/2016 2145   LABSPEC 1.024 12/09/2016 2145   PHURINE 5.5 12/09/2016 2145   GLUCOSEU NEGATIVE 12/09/2016 2145   HGBUR NEGATIVE 12/09/2016 2145   BILIRUBINUR NEGATIVE 12/09/2016 2145   KETONESUR NEGATIVE 12/09/2016 2145    PROTEINUR NEGATIVE 12/09/2016 2145   UROBILINOGEN 1.0 10/13/2014 1215   NITRITE NEGATIVE 12/09/2016 2145   LEUKOCYTESUR NEGATIVE 12/09/2016 2145   Sepsis Labs Invalid input(s): PROCALCITONIN,  WBC,  LACTICIDVEN Microbiology No results found for this or any previous visit (from the past 240 hour(s)).  Time coordinating discharge:  SIGNED:  Standley Dakins, MD  [facility] 12/10/2016, 2:09 PM Pager [phone]  If 7PM-7AM, please contact night-coverage [email or web address] Password [email or web address]

## 2016-12-10 NOTE — Plan of Care (Signed)
66 year old female with history of hypertension diabetes presents to the ER with complaints of chest pain. Patient had chest pain with diaphoresis. EKG and cardiac markers and d-dimer and chest x-ray were unremarkable. At the time of my discussion with the ER physician, patient still has chest pain and ER physician will be placing patient on nitroglycerin. Patient with risk factors for ACS will be admitted for further workup.  Mercedes Riggs.

## 2016-12-10 NOTE — Consult Note (Signed)
Cardiology Consult    Patient ID: Mercedes Riggs MRN: [id number], DOB/AGE: February 03, 1951   Admit date: 12/09/2016 Date of Consult: 12/10/2016  Primary Physician: Elspeth Cho., MD Reason for Consult: Chest Pain Primary Cardiologist: New to [facility] Requesting Provider: Dr. Laural Benes   History of Present Illness    Mercedes Riggs is a 66 y.o. female with past medical history of HTN, Type 2 DM (diet-controlled), and GERD who is being seen today for the evaluation of chest pain at the request of Dr. Toniann Fail.   She presented to [facility] ED on 12/09/2016 for evaluation of chest pain which started earlier in the day. She reports traveling to [place] for a mission trip two weeks prior and had an episode of chest pain while on the trip. She reports associated diaphoresis and the pain lasted for the entire day, at least 8-10 hours. She was then in her usual state of health for the rest of the trip and the past two weeks. Has been able to perform household chores and walk around the grocery store without any recurrent pain or dyspnea.   Yesterday evening, while consuming dinner at [employer], she developed a pressure along her left-pectoral region which was worse with eating. Says it felt different than her typical acid reflux. She denies any radiating pain, dyspnea, nausea, vomiting, or diaphoresis. The pain lasted for 4+ hours and was slightly improved with IV Morphine. No association with exertion. She denies any recurrent pain since being admitted.   She denies any prior cardiac history. No known family history of CAD. No prior tobacco use. Does consume alcohol socially.   Initial labs show WBC of 10.9, Hgb 12.5, platelets 315. Na+ 142, K+ 3.4, creatinine 1.10. D-dimer negative. BNP 22.5. Initial two troponin values have been negative. CXR showing a pulmonary nodule along the left suprahilar region with CT Chest recommended in the future and no acute cardiopulmonary abnormalities. EKG shows  NSR, HR 88, with nonspecific ST changes along the inferolateral leads.    Past Medical History   Past Medical History:  Diagnosis Date  . Diabetes mellitus without complication (HCC)   . GERD (gastroesophageal reflux disease)   . Hypertension   . Migraines     Past Surgical History:  Procedure Laterality Date  . ABDOMINAL HYSTERECTOMY    . LUNG REMOVAL, PARTIAL       Allergies  Allergies  Allergen Reactions  . Codeine Itching  . Percocet [Oxycodone-Acetaminophen] Other (See Comments)    Hallucinations  . Tramadol Rash    Inpatient Medications    . aspirin  324 mg Oral Once  . aspirin EC  325 mg Oral Daily  . enoxaparin (LOVENOX) injection  40 mg Subcutaneous Q24H  . hydrochlorothiazide  25 mg Oral Daily  . insulin aspart  0-9 Units Subcutaneous TID WC  . losartan  100 mg Oral Daily    Family History    Family History  Problem Relation Age of Onset  . Diabetes Mellitus II Father   . Diabetes Mellitus II Sister   . Diabetes Mellitus II Brother   . CAD Neg Hx     Social History    Social History   Social History  . Marital status: Divorced    Spouse name: N/A  . Number of children: N/A  . Years of education: N/A   Occupational History  . Not on file.   Social History Main Topics  . Smoking status: Never Smoker  . Smokeless tobacco: Never  Used  . Alcohol use Yes     Comment: glass wine/night  . Drug use: Unknown  . Sexual activity: Not on file   Other Topics Concern  . Not on file   Social History Narrative  . No narrative on file     Review of Systems    General:  No chills, fever, night sweats or weight changes.  Cardiovascular:  No dyspnea on exertion, edema, orthopnea, palpitations, paroxysmal nocturnal dyspnea. Positive for chest pain.  Dermatological: No rash, lesions/masses Respiratory: No cough, dyspnea Urologic: No hematuria, dysuria Abdominal:   No nausea, vomiting, diarrhea, bright red blood per rectum, melena, or  hematemesis Neurologic:  No visual changes, wkns, changes in mental status. All other systems reviewed and are otherwise negative except as noted above.  Physical Exam    Blood pressure (!) 151/88, pulse 68, temperature 98.4 F (36.9 C), temperature source Oral, resp. rate 20, height 5\' 3"  (1.6 m), weight 184 lb 11.2 oz (83.8 kg), SpO2 99 %.  General: Pleasant African American female appearing in NAD. Psych: Normal affect. Neuro: Alert and oriented X 3. Moves all extremities spontaneously. HEENT: Normal  Neck: Supple without bruits or JVD. Lungs:  Resp regular and unlabored, CTA without wheezing or rales. Heart: RRR no s3, s4, or murmurs. Left pectoral region tender to palpation.  Abdomen: Soft, non-tender, non-distended, BS + x 4.  Extremities: No clubbing, cyanosis or edema. DP/PT/Radials 2+ and equal bilaterally.  Labs    Troponin (Point of Care Test) No results for input(s): TROPIPOC in the last 72 hours.  Recent Labs  12/09/16 2100 12/10/16 0255  TROPONINI <0.03 <0.03   Lab Results  Component Value Date   WBC 10.4 12/10/2016   HGB 11.9 (L) 12/10/2016   HCT 38.3 12/10/2016   MCV 83.4 12/10/2016   PLT 289 12/10/2016    Recent Labs Lab 12/09/16 2100 12/10/16 0255  NA 142  --   K 3.4*  --   CL 103  --   CO2 30  --   BUN 13  --   CREATININE 1.10* 1.08*  CALCIUM 9.8  --   GLUCOSE 165*  --    No results found for: CHOL, HDL, LDLCALC, TRIG Lab Results  Component Value Date   DDIMER 0.33 12/09/2016     Radiology Studies    Dg Chest 2 View: Result Date: 12/09/2016 CLINICAL DATA:  Chest pain. EXAM: CHEST  2 VIEW COMPARISON:  03/06/2016 FINDINGS: The lungs are clear without focal pneumonia, edema, pneumothorax or pleural effusion. Small pulmonary nodule identified in the left suprahilar region. The cardiopericardial silhouette is within normal limits for size. The visualized bony structures of the thorax are intact. Telemetry leads overlie the chest. IMPRESSION:  Pulmonary nodule identified left suprahilar region, not definitely present on prior studies. CT chest without contrast recommended to further evaluate. Otherwise no focal airspace consolidation or pulmonary edema. No pleural effusion. Electronically Signed   By: Kennith CenterEric  Mansell M.D.   On: 12/09/2016 21:27    EKG & Cardiac Imaging    EKG: NSR, HR 88, with nonspecific ST changes along the inferolateral leads. - Personally Reviewed  Echocardiogram: 10/15/2014 Study Conclusions  - Left ventricle: The cavity size was normal. Wall thickness was   increased in a pattern of moderate LVH. The estimated ejection   fraction was 60%. Wall motion was normal; there were no regional   wall motion abnormalities. - Right ventricle: The cavity size was normal. Systolic function   was normal. - Pulmonary  arteries: PA peak pressure: 42 mm Hg (S).   Assessment & Plan    1. Atypical Chest Pain - the patient presented for evaluation of chest pain which started while consuming dinner at [employer]. Says it felt different than her typical acid reflux burt was worse with continued food intake. She denies any radiating pain, dyspnea, nausea, vomiting, or diaphoresis. The pain lasted for 4+ hours and was slightly improved with IV Morphine. No association with exertion.  - she is tender to palpation along her left pectoral region.  - she does have cardiac risk factors including HTN and Type 2 DM but overall her symptoms seem atypical for a cardiac etiology. Initial two troponin values have been negative and EKG shows NSR, HR 88, with nonspecific ST changes along the inferolateral leads. Will recheck Troponin value and repeat EKG. If no significant abnormalities, could consider stress testing as an outpatient for risk stratification.   2. HTN - BP at 146/88 to 181/108 since admission, improved to 151/88 on most recent check.  - continue PTA medication regimen.   3. Type 2 DM - Hgb A1c at 6.5 in 2016. Not on any  medications prior to admission. Reports this is "diet-controlled".   4. Pulmonary Nodule - noted on CXR this admission. Follow-up CT Chest recommended as an outpatient.   Signed, Ellsworth Lennox, PA-C 12/10/2016, 7:20 AM Pager: [phone]  Patient seen and examined.  History reviewed as above with additional history as noted here.  She stated that she awoke yesterday morning and felt somewhat dizzy as well as woozy and mildly nauseous.  This got better during the day but when she went out to eat last night while sitting at the table she became nauseous and 8atecrackers.  She then developed chest discomfort in her left chest area and then left the restaurant.  She felt nauseous did not feel good and was concerned because she had chest discomfort 2 weeks previously when she was on a mission strip in [place] associated with some diaphoresis.  She presented to the emergency room her troponins have been negative and she was given a shot of morphine.  The discomfort gradually resolved.  She states that she feels fine this morning and wishes to go home.  She does not have exertional angina.  She also was concerned because she did not feel well and felt that her feet were swelling she also had a headache last night.  Her exam was unremarkable.  Cardiac exam was normal there was no edema peripheral pulses were intact and there was no bruits.  1.  Her pain is atypical for myocardial ischemia.  She is to have a follow-up EKG this morning and a CT to evaluate a lung nodule.  She is tender to palpation in her left chest area. 2.  Hypertensive heart disease blood pressure not well controlled 3.  Non-insulin-dependent diabetes 4.  Obesity  Recommendations:  She may have an outpatient stress test to evaluate her in light of her multiple risk factors although the pain is atypical for ischemia.  She should have a follow-up EKG this morning.  Darden Palmer. MD [facility] 8:47 AM 12/10/2016

## 2016-12-10 NOTE — H&P (Addendum)
History and Physical    Mercedes ChapelWillene M Hennigan ZOX:[id number] DOB: 1950-06-15 DOA: 12/09/2016  PCP: Elspeth Choerrell, Grace E., MD  Patient coming from: Home.  Chief Complaint: Chest pain.  HPI: Mercedes Riggs is a 66 y.o. female with history of hypertension and diabetes mellitus presents to the ER with complaints of chest pain. Patient started developing chest pain last evening while having dinner at the restaurant. Pain was in the left side of the chest pressure-like nonradiating with no associated shortness of breath or diaphoresis. Patient states 2 weeks ago when patient was in [place] she had a similar pain at the time patient also had diaphoresis. This time patient chest pain persisted and patient came to the ER. Patient had mild nausea along with chest pain.   ED Course: In the ER cardiac markers, EKG and chest x-ray were unremarkable. Chest x-ray did show pulmonary nodule. Patient chest pain resolved with IV morphine. Blood pressure was elevated. Patient is being admitted for further management of chest pain. At my exam patient is chest pain-free.  Review of Systems: As per HPI, rest all negative.   Past Medical History:  Diagnosis Date  . Diabetes mellitus without complication (HCC)   . GERD (gastroesophageal reflux disease)   . Hypertension   . Migraines     Past Surgical History:  Procedure Laterality Date  . ABDOMINAL HYSTERECTOMY    . LUNG REMOVAL, PARTIAL       reports that she has never smoked. She has never used smokeless tobacco. She reports that she drinks alcohol. Her drug history is not on file.  Allergies  Allergen Reactions  . Codeine Itching  . Percocet [Oxycodone-Acetaminophen] Other (See Comments)    Hallucinations  . Tramadol Rash    Family History  Problem Relation Age of Onset  . Diabetes Mellitus II Father   . Diabetes Mellitus II Sister   . Diabetes Mellitus II Brother   . CAD Neg Hx     Prior to Admission medications   Medication Sig Start Date End  Date Taking? Authorizing Provider  Clobetasol Propionate (TEMOVATE) 0.05 % external spray Apply topically as directed. 10/09/14   [provider]  DULoxetine (CYMBALTA) 60 MG capsule Take 60 mg by mouth daily. 10/05/14   [provider]  labetalol (NORMODYNE) 300 MG tablet Take 300 mg by mouth 2 (two) times daily. 10/12/14   [provider]  Misc. Devices (HEART RATE MONITOR) MISC 1 Units by Does not apply route as directed. 10/15/14   Robbie LisSimmons, Brittainy M, PA-C  ondansetron (ZOFRAN) 4 MG tablet Take 1 tablet (4 mg total) by mouth every 6 (six) hours as needed for nausea or vomiting. 03/06/16   Pisciotta, Joni ReiningNicole, PA-C  SUMAtriptan (IMITREX) 50 MG tablet Take 1 tablet by mouth once as needed. Migraine.  May repeat every 2 hours if no relief.  Max 4 tabs/24 hours. 07/14/14   [provider]  Allene DillonVANATOL LQ [phone] MG/15ML SOLN Take by mouth as directed. 09/23/14   [provider]  VENTOLIN HFA 108 (90 BASE) MCG/ACT inhaler Inhale 2 puffs into the lungs every 4 (four) hours as needed for wheezing or shortness of breath.  09/24/14   [provider]    Physical Exam: Vitals:   12/09/16 2315 12/09/16 2330 12/09/16 2345 12/10/16 0116  BP:  (!) 163/97  (!) 158/94  Pulse: 67 61 61 62  Resp: 19 18 (!) 25 20  Temp:    98.2 F (36.8 C)  TempSrc:  Oral  SpO2: 100% 97% 100% 99%  Weight:    83.8 kg (184 lb 11.2 oz)  Height:    5\' 3"  (1.6 m)      Constitutional: Moderately built and nourished. Vitals:   12/09/16 2315 12/09/16 2330 12/09/16 2345 12/10/16 0116  BP:  (!) 163/97  (!) 158/94  Pulse: 67 61 61 62  Resp: 19 18 (!) 25 20  Temp:    98.2 F (36.8 C)  TempSrc:    Oral  SpO2: 100% 97% 100% 99%  Weight:    83.8 kg (184 lb 11.2 oz)  Height:    5\' 3"  (1.6 m)   Eyes: Anicteric no pallor. ENMT: No discharge from the ears eyes nose and mouth. Neck: No mass felt. No JVD appreciated. Respiratory: No rhonchi or crepitations. Cardiovascular: S1-S2  heard no murmurs appreciated. Abdomen: Soft nontender bowel sounds present. No guarding or rigidity. Musculoskeletal: No edema. No joint effusion. Skin: No rash. Skin appears warm. Neurologic: Alert awake oriented to time place and person. Moves all extremities. Psychiatric: Appears normal. Normal affect.   Labs on Admission: I have personally reviewed following labs and imaging studies  CBC:  Recent Labs Lab 12/09/16 2100  WBC 10.9*  HGB 12.5  HCT 39.1  MCV 83.7  PLT 315   Basic Metabolic Panel:  Recent Labs Lab 12/09/16 2100  NA 142  K 3.4*  CL 103  CO2 30  GLUCOSE 165*  BUN 13  CREATININE 1.10*  CALCIUM 9.8   GFR: Estimated Creatinine Clearance: 51.6 mL/min (A) (by C-G formula based on SCr of 1.1 mg/dL (H)). Liver Function Tests: No results for input(s): AST, ALT, ALKPHOS, BILITOT, PROT, ALBUMIN in the last 168 hours. No results for input(s): LIPASE, AMYLASE in the last 168 hours. No results for input(s): AMMONIA in the last 168 hours. Coagulation Profile: No results for input(s): INR, PROTIME in the last 168 hours. Cardiac Enzymes:  Recent Labs Lab 12/09/16 2100  TROPONINI <0.03   BNP (last 3 results) No results for input(s): PROBNP in the last 8760 hours. HbA1C: No results for input(s): HGBA1C in the last 72 hours. CBG:  Recent Labs Lab 12/09/16 2101  GLUCAP 165*   Lipid Profile: No results for input(s): CHOL, HDL, LDLCALC, TRIG, CHOLHDL, LDLDIRECT in the last 72 hours. Thyroid Function Tests: No results for input(s): TSH, T4TOTAL, FREET4, T3FREE, THYROIDAB in the last 72 hours. Anemia Panel: No results for input(s): VITAMINB12, FOLATE, FERRITIN, TIBC, IRON, RETICCTPCT in the last 72 hours. Urine analysis:    Component Value Date/Time   COLORURINE YELLOW 12/09/2016 2145   APPEARANCEUR CLEAR 12/09/2016 2145   LABSPEC 1.024 12/09/2016 2145   PHURINE 5.5 12/09/2016 2145   GLUCOSEU NEGATIVE 12/09/2016 2145   HGBUR NEGATIVE 12/09/2016 2145    BILIRUBINUR NEGATIVE 12/09/2016 2145   KETONESUR NEGATIVE 12/09/2016 2145   PROTEINUR NEGATIVE 12/09/2016 2145   UROBILINOGEN 1.0 10/13/2014 1215   NITRITE NEGATIVE 12/09/2016 2145   LEUKOCYTESUR NEGATIVE 12/09/2016 2145   Sepsis Labs: @LABRCNTIP (procalcitonin:4,lacticidven:4) )No results found for this or any previous visit (from the past 240 hour(s)).   Radiological Exams on Admission: Dg Chest 2 View  Result Date: 12/09/2016 CLINICAL DATA:  Chest pain. EXAM: CHEST  2 VIEW COMPARISON:  03/06/2016 FINDINGS: The lungs are clear without focal pneumonia, edema, pneumothorax or pleural effusion. Small pulmonary nodule identified in the left suprahilar region. The cardiopericardial silhouette is within normal limits for size. The visualized bony structures of the thorax are intact. Telemetry leads overlie the chest.  IMPRESSION: Pulmonary nodule identified left suprahilar region, not definitely present on prior studies. CT chest without contrast recommended to further evaluate. Otherwise no focal airspace consolidation or pulmonary edema. No pleural effusion. Electronically Signed   By: Kennith Center M.D.   On: 12/09/2016 21:27    EKG: Independently reviewed. Normal sinus rhythm.  Assessment/Plan Principal Problem:   Chest pain Active Problems:   HTN (hypertension)   Diabetes mellitus (HCC)    1. Chest pain - with history of diabetes mellitus and hypertension will admit for observation to rule out ACS. Check cardiac markers 2-D echo. When necessary nitroglycerin. Consult cardiology in a.m.  2. Hypertension - patient states she was recently placed on Cozaar and hydrochlorothiazide 3 months ago. Since blood pressure is elevated we will keep patient on when necessary IV hydralazine. Follow blood pressure trends. 3. Diabetes mellitus type 2 on diet - I have placed patient on sliding scale coverage. 4. History of GERD on PPI. 5. Elevated creatinine - last creatinine in care everywhere in 2015  was 1.1. Follow metabolic panel. 6. Pulmonary nodule - follow CT chest.  UDS is pending. I have reviewed patient's old charts and labs.   DVT prophylaxis: Lovenox. Code Status: Full code.  Family Communication: Discussed with patient.  Disposition Plan: Home.  Consults called: None.  Admission status: Observation.    Eduard Clos MD [facility] Pager [phone].  If 7PM-7AM, please contact night-coverage [email or web address] Password [facility]  12/10/2016, 2:33 AM

## 2016-12-12 ENCOUNTER — Telehealth (HOSPITAL_COMMUNITY): Payer: Self-pay | Admitting: *Deleted

## 2016-12-12 NOTE — Telephone Encounter (Signed)
Left message on voicemail in reference to upcoming appointment scheduled for 12/13/16. Phone number given for a call back so details instructions can be given.  Tobby Fawcett Jacqueline   

## 2016-12-13 ENCOUNTER — Ambulatory Visit (HOSPITAL_COMMUNITY): Payer: BC Managed Care – PPO | Attending: Internal Medicine

## 2016-12-13 DIAGNOSIS — R072 Precordial pain: Secondary | ICD-10-CM

## 2016-12-13 LAB — MYOCARDIAL PERFUSION IMAGING
CHL CUP NUCLEAR SDS: 5
CHL CUP NUCLEAR SRS: 7
CHL CUP NUCLEAR SSS: 12
CHL CUP RESTING HR STRESS: 64 {beats}/min
CSEPPHR: 102 {beats}/min
LV dias vol: 65 mL (ref 46–106)
LV sys vol: 20 mL
RATE: 0.25
TID: 0.87

## 2016-12-13 MED ORDER — REGADENOSON 0.4 MG/5ML IV SOLN
0.4000 mg | Freq: Once | INTRAVENOUS | Status: AC
  Administered 2016-12-13: 0.4 mg via INTRAVENOUS

## 2016-12-13 MED ORDER — TECHNETIUM TC 99M TETROFOSMIN IV KIT
32.7000 | PACK | Freq: Once | INTRAVENOUS | Status: AC | PRN
  Administered 2016-12-13: 32.7 via INTRAVENOUS
  Filled 2016-12-13: qty 33

## 2016-12-13 MED ORDER — TECHNETIUM TC 99M TETROFOSMIN IV KIT
10.1000 | PACK | Freq: Once | INTRAVENOUS | Status: AC | PRN
  Administered 2016-12-13: 10.1 via INTRAVENOUS
  Filled 2016-12-13: qty 11

## 2016-12-14 ENCOUNTER — Encounter (HOSPITAL_COMMUNITY): Payer: BC Managed Care – PPO

## 2017-06-27 ENCOUNTER — Emergency Department (HOSPITAL_BASED_OUTPATIENT_CLINIC_OR_DEPARTMENT_OTHER)
Admission: EM | Admit: 2017-06-27 | Discharge: 2017-06-27 | Disposition: A | Discharge status: Home / Self Care | Payer: BC Managed Care – PPO | Attending: Physician Assistant | Admitting: Physician Assistant

## 2017-06-27 ENCOUNTER — Other Ambulatory Visit: Payer: Self-pay

## 2017-06-27 ENCOUNTER — Encounter (HOSPITAL_BASED_OUTPATIENT_CLINIC_OR_DEPARTMENT_OTHER): Payer: Self-pay

## 2017-06-27 DIAGNOSIS — E119 Type 2 diabetes mellitus without complications: Secondary | ICD-10-CM | POA: Insufficient documentation

## 2017-06-27 DIAGNOSIS — I10 Essential (primary) hypertension: Secondary | ICD-10-CM | POA: Insufficient documentation

## 2017-06-27 DIAGNOSIS — R112 Nausea with vomiting, unspecified: Secondary | ICD-10-CM | POA: Diagnosis present

## 2017-06-27 DIAGNOSIS — R197 Diarrhea, unspecified: Secondary | ICD-10-CM | POA: Diagnosis not present

## 2017-06-27 HISTORY — DX: Bursopathy, unspecified: M71.9

## 2017-06-27 LAB — CBC
HEMATOCRIT: 43.1 % (ref 36.0–46.0)
HEMOGLOBIN: 13.9 g/dL (ref 12.0–15.0)
MCH: 26.9 pg (ref 26.0–34.0)
MCHC: 32.3 g/dL (ref 30.0–36.0)
MCV: 83.5 fL (ref 78.0–100.0)
Platelets: 332 10*3/uL (ref 150–400)
RBC: 5.16 MIL/uL — ABNORMAL HIGH (ref 3.87–5.11)
RDW: 15.4 % (ref 11.5–15.5)
WBC: 13.5 10*3/uL — ABNORMAL HIGH (ref 4.0–10.5)

## 2017-06-27 LAB — URINALYSIS, MICROSCOPIC (REFLEX)

## 2017-06-27 LAB — COMPREHENSIVE METABOLIC PANEL
ALBUMIN: 4.6 g/dL (ref 3.5–5.0)
ALK PHOS: 127 U/L — AB (ref 38–126)
ALT: 19 U/L (ref 14–54)
ANION GAP: 14 (ref 5–15)
AST: 28 U/L (ref 15–41)
BUN: 14 mg/dL (ref 6–20)
CO2: 24 mmol/L (ref 22–32)
Calcium: 10.1 mg/dL (ref 8.9–10.3)
Chloride: 102 mmol/L (ref 101–111)
Creatinine, Ser: 1.27 mg/dL — ABNORMAL HIGH (ref 0.44–1.00)
GFR calc Af Amer: 50 mL/min — ABNORMAL LOW (ref >60)
GFR calc non Af Amer: 43 mL/min — ABNORMAL LOW (ref >60)
GLUCOSE: 159 mg/dL — AB (ref 65–99)
POTASSIUM: 3.6 mmol/L (ref 3.5–5.1)
SODIUM: 140 mmol/L (ref 135–145)
Total Bilirubin: 0.9 mg/dL (ref 0.3–1.2)
Total Protein: 8.9 g/dL — ABNORMAL HIGH (ref 6.5–8.1)

## 2017-06-27 LAB — CBG MONITORING, ED: Glucose-Capillary: 133 mg/dL — ABNORMAL HIGH (ref 65–99)

## 2017-06-27 LAB — URINALYSIS, ROUTINE W REFLEX MICROSCOPIC
Glucose, UA: NEGATIVE mg/dL
Ketones, ur: 15 mg/dL — AB
Nitrite: NEGATIVE
Protein, ur: 30 mg/dL — AB
pH: 5 (ref 5.0–8.0)

## 2017-06-27 LAB — LIPASE, BLOOD: Lipase: 24 U/L (ref 11–51)

## 2017-06-27 MED ORDER — ONDANSETRON 4 MG PO TBDP: 4.0000 mg | ORAL_TABLET | Freq: Three times a day (TID) | ORAL | 0 refills | Status: AC | PRN

## 2017-06-27 MED ORDER — ONDANSETRON 4 MG PO TBDP
4.0000 mg | ORAL_TABLET | Freq: Once | ORAL | Status: AC | PRN
  Administered 2017-06-27: 4 mg via ORAL
  Filled 2017-06-27: qty 1

## 2017-06-27 NOTE — ED Provider Notes (Signed)
[facility] EMERGENCY DEPARTMENT Provider Note   CSN: [id number] Arrival date & time: 06/27/17  1928     History   Chief Complaint Chief Complaint  Patient presents with  . Emesis    HPI LEASHA GOLDBERGER is a 67 y.o. female.  HPI   Patient is a 67 year old female with past medical history significant for hypertension diabetes and migraines.  Patient is presenting today with nausea vomiting diarrhea.  There is right after eating some cream [place]and[place].  Patient had a Zofran in our triage area and has not had any nausea vomiting or diarrhea since.  Patient has no focal abdominal pain.  Past Medical History:  Diagnosis Date  . Bursitis   . Diabetes mellitus without complication (HCC)   . GERD (gastroesophageal reflux disease)   . Hypertension   . Migraines     Patient Active Problem List   Diagnosis Date Noted  . Chest pain 12/09/2016  . Syncope 10/13/2014  . HTN (hypertension) 10/13/2014  . Diabetes mellitus (HCC) 10/13/2014    Past Surgical History:  Procedure Laterality Date  . ABDOMINAL HYSTERECTOMY    . LUNG REMOVAL, PARTIAL      OB History    No data available       Home Medications    Prior to Admission medications   Medication Sig Start Date End Date Taking? Authorizing Provider  Artificial Tear Ointment (DRY EYES OP) Place 1 drop into both eyes daily as needed (dry eyes).    [provider]  hydrALAZINE (APRESOLINE) 10 MG tablet Take 1 tablet (10 mg total) by mouth 3 (three) times daily. 12/10/16   Johnson, Clanford L, MD  losartan-hydrochlorothiazide (HYZAAR) 100-25 MG tablet Take 1 tablet by mouth daily. 10/14/16 10/14/17  [provider]  omeprazole (PRILOSEC) 20 MG capsule Take 20 mg by mouth daily.    [provider]  ondansetron (ZOFRAN ODT) 4 MG disintegrating tablet Take 1 tablet (4 mg total) by mouth every 8 (eight) hours as needed for nausea or vomiting. 06/27/17   Quinten Allerton Lyn, MD  VENTOLIN HFA 108 (90  BASE) MCG/ACT inhaler Inhale 2 puffs into the lungs every 4 (four) hours as needed for wheezing or shortness of breath.  09/24/14   [provider]    Family History Family History  Problem Relation Age of Onset  . Diabetes Mellitus II Father   . Diabetes Mellitus II Sister   . Diabetes Mellitus II Brother   . CAD Neg Hx     Social History Social History   Tobacco Use  . Smoking status: Never Smoker  . Smokeless tobacco: Never Used  Substance Use Topics  . Alcohol use: Yes    Comment: weekly  . Drug use: No     Allergies   Aspirin; Codeine; Percocet [oxycodone-acetaminophen]; Prednisone; Propoxyphene; and Tramadol   Review of Systems Review of Systems  Constitutional: Negative for activity change.  Respiratory: Negative for shortness of breath.   Cardiovascular: Negative for chest pain.  Gastrointestinal: Positive for diarrhea, nausea and vomiting. Negative for abdominal pain.  All other systems reviewed and are negative.    Physical Exam Updated Vital Signs BP (!) 162/96 (BP Location: Right Arm)   Pulse 93   Temp 98.3 F (36.8 C) (Oral)   Resp 18   Ht 5\' 3"  (1.6 m)   Wt 81.6 kg (180 lb)   SpO2 100%   BMI 31.89 kg/m   Physical Exam  Constitutional: She is oriented to person, place,  and time. She appears well-developed and well-nourished.  HENT:  Head: Normocephalic and atraumatic.  Eyes: Right eye exhibits no discharge. Left eye exhibits no discharge.  Cardiovascular: Normal rate, regular rhythm and normal heart sounds.  No murmur heard. Pulmonary/Chest: Effort normal and breath sounds normal. She has no wheezes. She has no rales.  Abdominal: Soft. She exhibits no distension. There is no tenderness.  Diffuse discomfort.  No focal tenderness.  Neurological: She is oriented to person, place, and time.  Skin: Skin is warm and dry. She is not diaphoretic.  Psychiatric: She has a normal mood and affect.  Nursing note and vitals reviewed.    ED  Treatments / Results  Labs (all labs ordered are listed, but only abnormal results are displayed) Labs Reviewed  COMPREHENSIVE METABOLIC PANEL - Abnormal; Notable for the following components:      Result Value   Glucose, Bld 159 (*)    Creatinine, Ser 1.27 (*)    Total Protein 8.9 (*)    Alkaline Phosphatase 127 (*)    GFR calc non Af Amer 43 (*)    GFR calc Af Amer 50 (*)    All other components within normal limits  CBC - Abnormal; Notable for the following components:   WBC 13.5 (*)    RBC 5.16 (*)    All other components within normal limits  URINALYSIS, ROUTINE W REFLEX MICROSCOPIC - Abnormal; Notable for the following components:   Specific Gravity, Urine >1.030 (*)    Hgb urine dipstick TRACE (*)    Bilirubin Urine SMALL (*)    Ketones, ur 15 (*)    Protein, ur 30 (*)    Leukocytes, UA TRACE (*)    All other components within normal limits  URINALYSIS, MICROSCOPIC (REFLEX) - Abnormal; Notable for the following components:   Bacteria, UA FEW (*)    Squamous Epithelial / LPF 0-5 (*)    All other components within normal limits  CBG MONITORING, ED - Abnormal; Notable for the following components:   Glucose-Capillary 133 (*)    All other components within normal limits  LIPASE, BLOOD    EKG  EKG Interpretation None       Radiology No results found.  Procedures Procedures (including critical care time)  Medications Ordered in ED Medications  ondansetron (ZOFRAN-ODT) disintegrating tablet 4 mg (4 mg Oral Given 06/27/17 1953)     Initial Impression / Assessment and Plan / ED Course  I have reviewed the triage vital signs and the nursing notes.  Pertinent labs & imaging results that were available during my care of the patient were reviewed by me and considered in my medical decision making (see chart for details).     Patient here with nausea vomiting diarrhea after eating some creamed spinach.  Likely the culprit food.  Also patient could have viral  gastroenteritis.  Doubt intra-abdominal pathology requiring procedure given the constellation of symptoms of nausea vomiting and diarrhea.  Patient also has no fever.  We will have her return with any kind of focal symptoms fever, or continued vomiting.  Otherwise patient took Zofran and is been to drinking ginger ale without issue.  Note patient supposed to have a surgery on her left eye tomorrow.  It is elective and we recommended that she not have it given the intraocular pressure increases with vomiting.  Final Clinical Impressions(s) / ED Diagnoses   Final diagnoses:  Nausea vomiting and diarrhea    ED Discharge Orders  Ordered    ondansetron (ZOFRAN ODT) 4 MG disintegrating tablet  Every 8 hours PRN     06/27/17 2240       Abelino Derrick, MD 06/27/17 2243

## 2017-06-27 NOTE — ED Notes (Signed)
Pt has not had any vomiting since receiving zofran and no diarrhea since arrival to ED.

## 2017-06-27 NOTE — Discharge Instructions (Signed)
We think you likely have a viral gastroenteritis given that you are nausea vomiting and diarrhea.  If you have any change in symptoms, any any focal abdominal pain, any fevers, or any other concerns please return immediately to the emergency department.

## 2017-06-27 NOTE — ED Triage Notes (Signed)
Pt c/o n/v/d started approx 4pm-started 30 min after eating-NAD-presents to triage in  w/c

## 2017-06-27 NOTE — ED Notes (Signed)
ED Provider at bedside. 

## 2018-12-22 IMAGING — DX DG CHEST 2V
2 series · 2 of 2 positions shown · non-contrast
Comparison: 03/06/2016

CLINICAL DATA: Chest pain.

EXAM:
CHEST  2 VIEW

[chest pa]
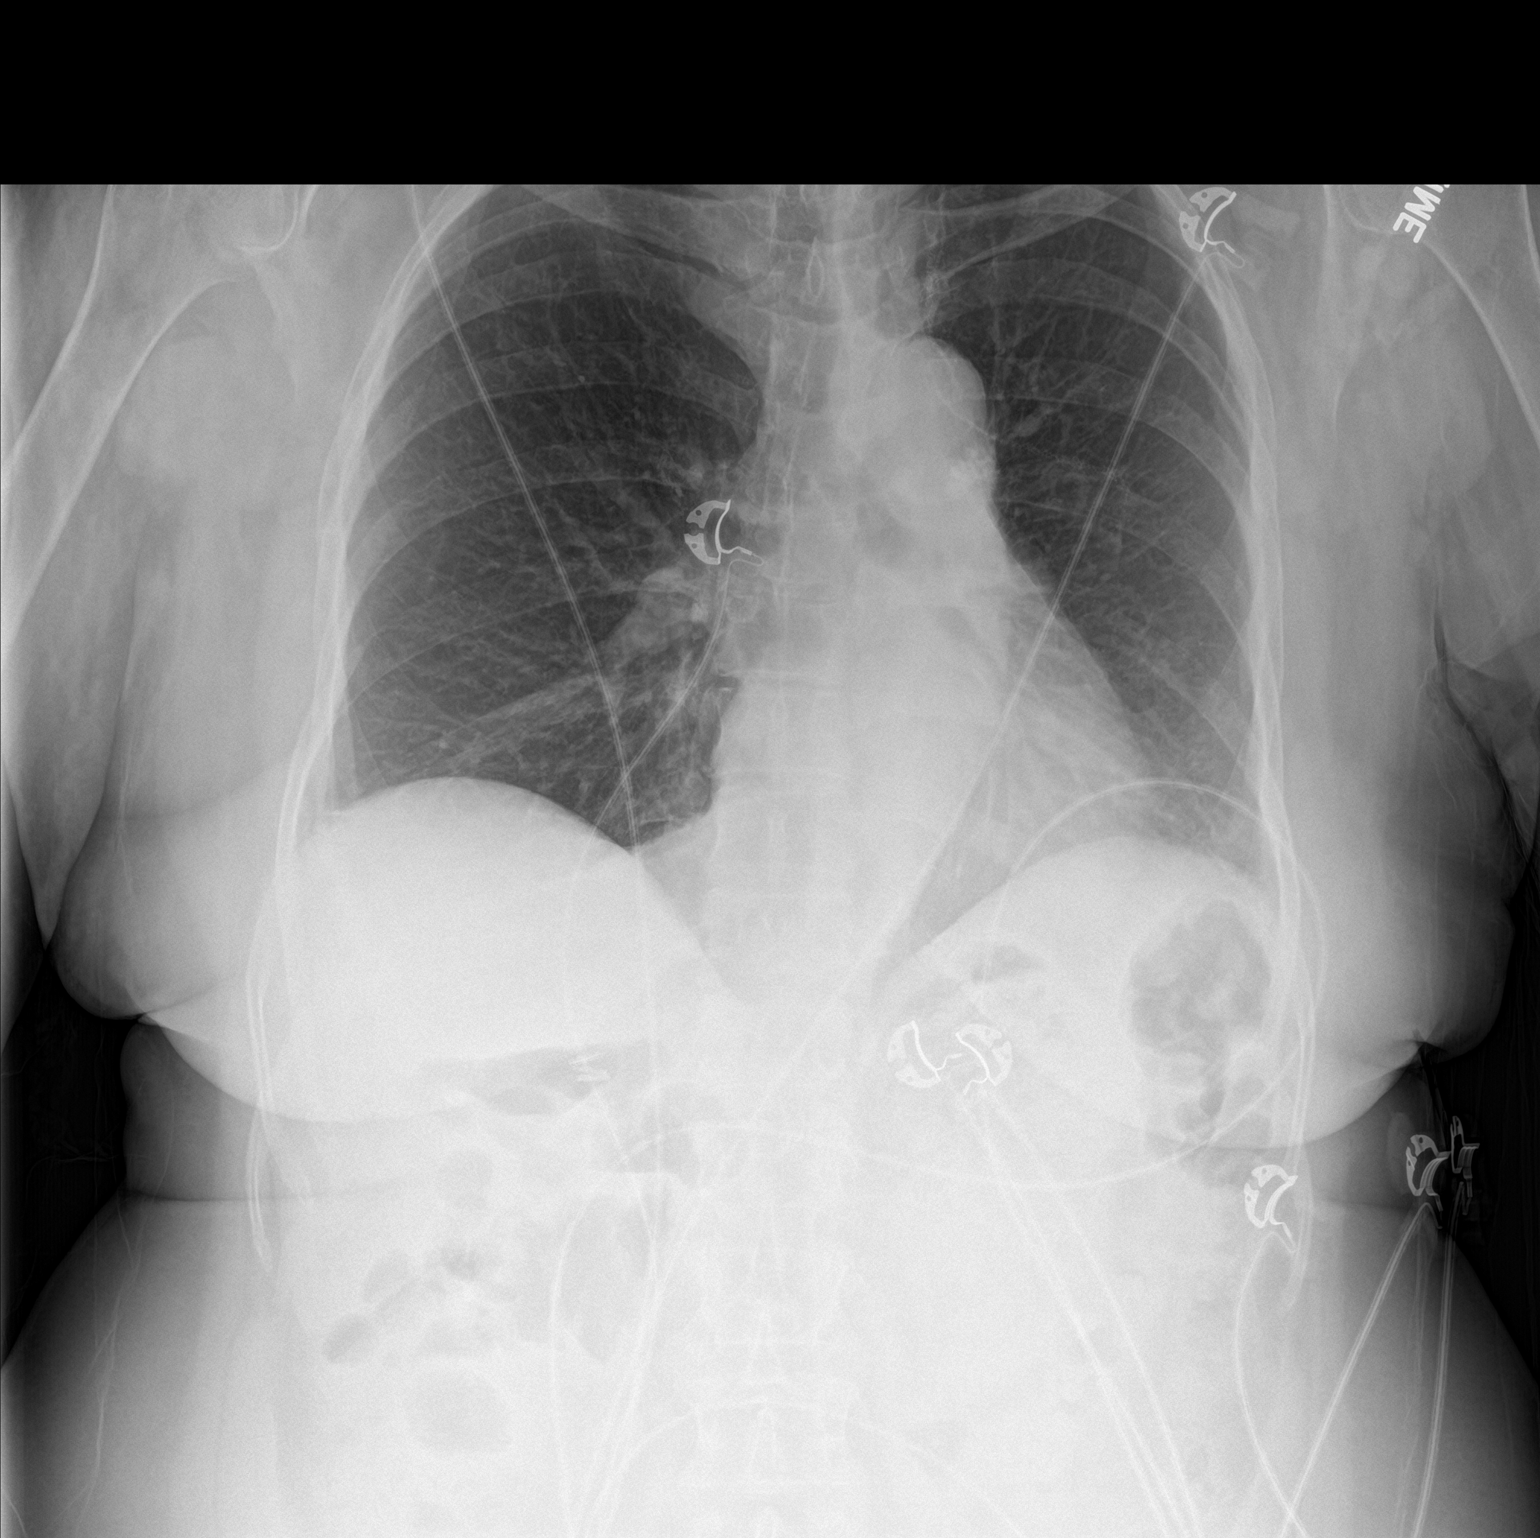

[chest lat]
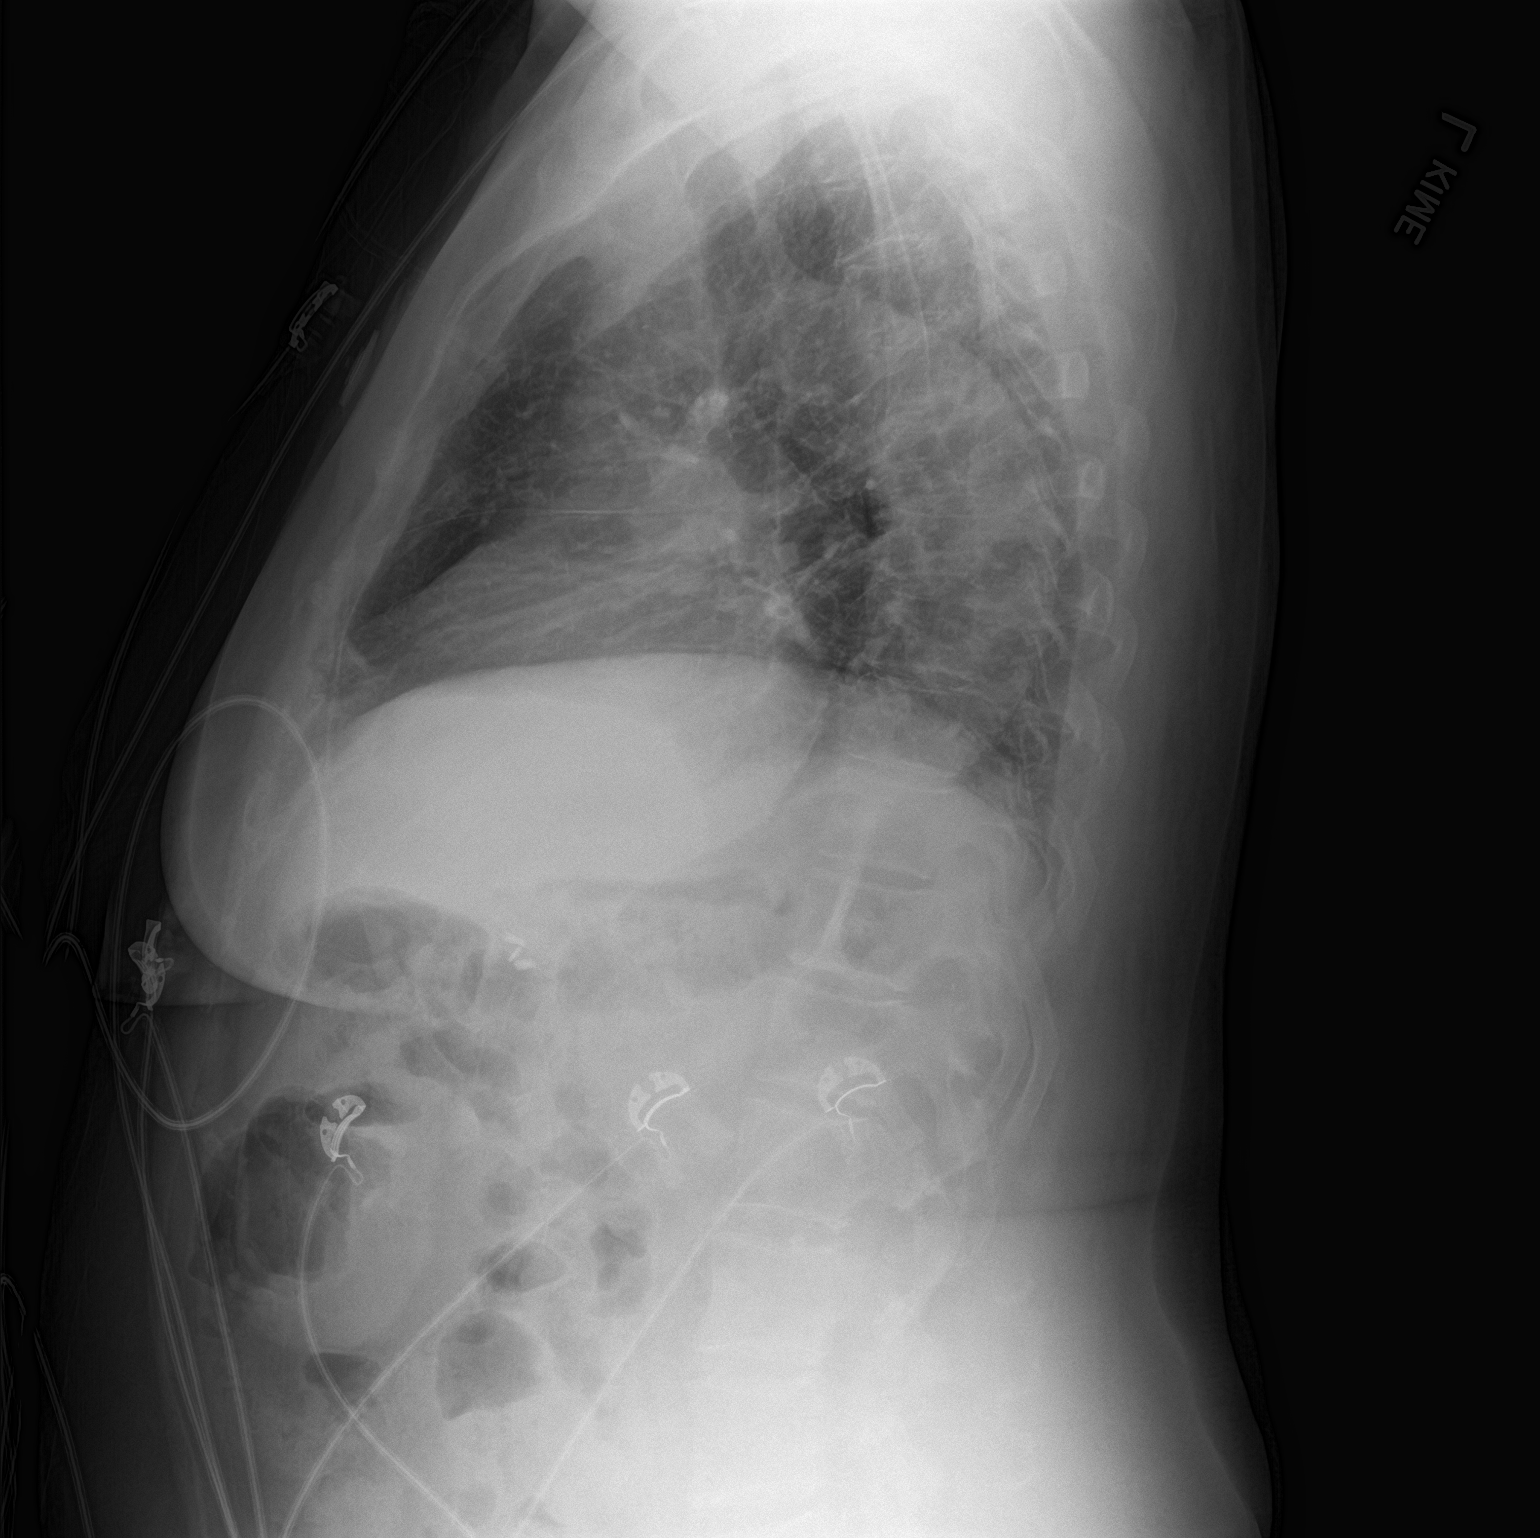

[2 of 2 positions shown; findings below may reference images not displayed]

FINDINGS: The lungs are clear without focal pneumonia, edema, pneumothorax or
pleural effusion. Small pulmonary nodule identified in the left
suprahilar region. The cardiopericardial silhouette is within normal
limits for size. The visualized bony structures of the thorax are
intact. Telemetry leads overlie the chest.
IMPRESSION: Pulmonary nodule identified left suprahilar region, not definitely
present on prior studies. CT chest without contrast recommended to
further evaluate.

Otherwise no focal airspace consolidation or pulmonary edema. No
pleural effusion.

## 2018-12-26 ENCOUNTER — Encounter (INDEPENDENT_AMBULATORY_CARE_PROVIDER_SITE_OTHER): Payer: Self-pay | Admitting: Ophthalmology

## 2018-12-30 ENCOUNTER — Encounter (INDEPENDENT_AMBULATORY_CARE_PROVIDER_SITE_OTHER): Payer: Medicare Other | Admitting: Ophthalmology

## 2018-12-30 ENCOUNTER — Other Ambulatory Visit: Payer: Self-pay

## 2018-12-30 DIAGNOSIS — E113292 Type 2 diabetes mellitus with mild nonproliferative diabetic retinopathy without macular edema, left eye: Secondary | ICD-10-CM

## 2018-12-30 DIAGNOSIS — I10 Essential (primary) hypertension: Secondary | ICD-10-CM

## 2018-12-30 DIAGNOSIS — H34811 Central retinal vein occlusion, right eye, with macular edema: Secondary | ICD-10-CM

## 2018-12-30 DIAGNOSIS — H35033 Hypertensive retinopathy, bilateral: Secondary | ICD-10-CM

## 2018-12-30 DIAGNOSIS — H2513 Age-related nuclear cataract, bilateral: Secondary | ICD-10-CM

## 2018-12-30 DIAGNOSIS — E11319 Type 2 diabetes mellitus with unspecified diabetic retinopathy without macular edema: Secondary | ICD-10-CM

## 2019-01-28 ENCOUNTER — Other Ambulatory Visit: Payer: Self-pay

## 2019-01-28 ENCOUNTER — Encounter (INDEPENDENT_AMBULATORY_CARE_PROVIDER_SITE_OTHER): Payer: Medicare Other | Admitting: Ophthalmology

## 2019-01-28 DIAGNOSIS — H2513 Age-related nuclear cataract, bilateral: Secondary | ICD-10-CM

## 2019-01-28 DIAGNOSIS — H35033 Hypertensive retinopathy, bilateral: Secondary | ICD-10-CM | POA: Diagnosis not present

## 2019-01-28 DIAGNOSIS — H33022 Retinal detachment with multiple breaks, left eye: Secondary | ICD-10-CM

## 2019-01-28 DIAGNOSIS — E113292 Type 2 diabetes mellitus with mild nonproliferative diabetic retinopathy without macular edema, left eye: Secondary | ICD-10-CM

## 2019-01-28 DIAGNOSIS — I10 Essential (primary) hypertension: Secondary | ICD-10-CM | POA: Diagnosis not present

## 2019-01-28 DIAGNOSIS — H34811 Central retinal vein occlusion, right eye, with macular edema: Secondary | ICD-10-CM | POA: Diagnosis not present

## 2019-01-28 DIAGNOSIS — E11311 Type 2 diabetes mellitus with unspecified diabetic retinopathy with macular edema: Secondary | ICD-10-CM

## 2019-01-28 DIAGNOSIS — H43813 Vitreous degeneration, bilateral: Secondary | ICD-10-CM

## 2019-02-07 ENCOUNTER — Encounter (INDEPENDENT_AMBULATORY_CARE_PROVIDER_SITE_OTHER): Payer: Medicare Other | Admitting: Ophthalmology

## 2019-02-07 ENCOUNTER — Other Ambulatory Visit: Payer: Self-pay

## 2019-02-07 DIAGNOSIS — H33302 Unspecified retinal break, left eye: Secondary | ICD-10-CM

## 2019-02-25 ENCOUNTER — Other Ambulatory Visit: Payer: Self-pay

## 2019-02-25 ENCOUNTER — Encounter (INDEPENDENT_AMBULATORY_CARE_PROVIDER_SITE_OTHER): Payer: Medicare Other | Admitting: Ophthalmology

## 2019-02-25 DIAGNOSIS — H34811 Central retinal vein occlusion, right eye, with macular edema: Secondary | ICD-10-CM | POA: Diagnosis not present

## 2019-02-25 DIAGNOSIS — H33302 Unspecified retinal break, left eye: Secondary | ICD-10-CM | POA: Diagnosis not present

## 2019-02-25 DIAGNOSIS — H35033 Hypertensive retinopathy, bilateral: Secondary | ICD-10-CM

## 2019-02-25 DIAGNOSIS — H43813 Vitreous degeneration, bilateral: Secondary | ICD-10-CM

## 2019-02-25 DIAGNOSIS — I10 Essential (primary) hypertension: Secondary | ICD-10-CM

## 2019-02-25 DIAGNOSIS — E11311 Type 2 diabetes mellitus with unspecified diabetic retinopathy with macular edema: Secondary | ICD-10-CM

## 2019-02-25 DIAGNOSIS — E113292 Type 2 diabetes mellitus with mild nonproliferative diabetic retinopathy without macular edema, left eye: Secondary | ICD-10-CM

## 2019-03-25 ENCOUNTER — Encounter (INDEPENDENT_AMBULATORY_CARE_PROVIDER_SITE_OTHER): Payer: Medicare Other | Admitting: Ophthalmology

## 2019-03-25 DIAGNOSIS — I10 Essential (primary) hypertension: Secondary | ICD-10-CM | POA: Diagnosis not present

## 2019-03-25 DIAGNOSIS — H43813 Vitreous degeneration, bilateral: Secondary | ICD-10-CM

## 2019-03-25 DIAGNOSIS — H2513 Age-related nuclear cataract, bilateral: Secondary | ICD-10-CM

## 2019-03-25 DIAGNOSIS — H33302 Unspecified retinal break, left eye: Secondary | ICD-10-CM | POA: Diagnosis not present

## 2019-03-25 DIAGNOSIS — H35033 Hypertensive retinopathy, bilateral: Secondary | ICD-10-CM | POA: Diagnosis not present

## 2019-03-25 DIAGNOSIS — H34811 Central retinal vein occlusion, right eye, with macular edema: Secondary | ICD-10-CM

## 2019-05-05 ENCOUNTER — Other Ambulatory Visit: Payer: Self-pay

## 2019-05-05 ENCOUNTER — Encounter (INDEPENDENT_AMBULATORY_CARE_PROVIDER_SITE_OTHER): Payer: Medicare Other | Admitting: Ophthalmology

## 2019-05-05 DIAGNOSIS — I10 Essential (primary) hypertension: Secondary | ICD-10-CM | POA: Diagnosis not present

## 2019-05-05 DIAGNOSIS — H33302 Unspecified retinal break, left eye: Secondary | ICD-10-CM | POA: Diagnosis not present

## 2019-05-05 DIAGNOSIS — H43813 Vitreous degeneration, bilateral: Secondary | ICD-10-CM

## 2019-05-05 DIAGNOSIS — H34811 Central retinal vein occlusion, right eye, with macular edema: Secondary | ICD-10-CM

## 2019-05-05 DIAGNOSIS — H35033 Hypertensive retinopathy, bilateral: Secondary | ICD-10-CM | POA: Diagnosis not present

## 2019-06-16 ENCOUNTER — Encounter (INDEPENDENT_AMBULATORY_CARE_PROVIDER_SITE_OTHER): Payer: Medicare Other | Admitting: Ophthalmology

## 2019-06-16 DIAGNOSIS — H35033 Hypertensive retinopathy, bilateral: Secondary | ICD-10-CM | POA: Diagnosis not present

## 2019-06-16 DIAGNOSIS — H33302 Unspecified retinal break, left eye: Secondary | ICD-10-CM | POA: Diagnosis not present

## 2019-06-16 DIAGNOSIS — I10 Essential (primary) hypertension: Secondary | ICD-10-CM | POA: Diagnosis not present

## 2019-06-16 DIAGNOSIS — H43813 Vitreous degeneration, bilateral: Secondary | ICD-10-CM

## 2019-06-16 DIAGNOSIS — H34811 Central retinal vein occlusion, right eye, with macular edema: Secondary | ICD-10-CM | POA: Diagnosis not present

## 2019-08-04 ENCOUNTER — Encounter (INDEPENDENT_AMBULATORY_CARE_PROVIDER_SITE_OTHER): Payer: Medicare Other | Admitting: Ophthalmology

## 2019-08-04 DIAGNOSIS — I10 Essential (primary) hypertension: Secondary | ICD-10-CM

## 2019-08-04 DIAGNOSIS — H33302 Unspecified retinal break, left eye: Secondary | ICD-10-CM

## 2019-08-04 DIAGNOSIS — H2513 Age-related nuclear cataract, bilateral: Secondary | ICD-10-CM

## 2019-08-04 DIAGNOSIS — H43813 Vitreous degeneration, bilateral: Secondary | ICD-10-CM

## 2019-08-04 DIAGNOSIS — H35033 Hypertensive retinopathy, bilateral: Secondary | ICD-10-CM | POA: Diagnosis not present

## 2019-08-04 DIAGNOSIS — H34811 Central retinal vein occlusion, right eye, with macular edema: Secondary | ICD-10-CM | POA: Diagnosis not present

## 2019-09-01 ENCOUNTER — Encounter (INDEPENDENT_AMBULATORY_CARE_PROVIDER_SITE_OTHER): Payer: Medicare Other | Admitting: Ophthalmology

## 2019-09-01 DIAGNOSIS — H35033 Hypertensive retinopathy, bilateral: Secondary | ICD-10-CM | POA: Diagnosis not present

## 2019-09-01 DIAGNOSIS — I10 Essential (primary) hypertension: Secondary | ICD-10-CM | POA: Diagnosis not present

## 2019-09-01 DIAGNOSIS — H34811 Central retinal vein occlusion, right eye, with macular edema: Secondary | ICD-10-CM | POA: Diagnosis not present

## 2019-09-01 DIAGNOSIS — H43813 Vitreous degeneration, bilateral: Secondary | ICD-10-CM

## 2019-10-13 ENCOUNTER — Encounter (INDEPENDENT_AMBULATORY_CARE_PROVIDER_SITE_OTHER): Payer: Medicare Other | Admitting: Ophthalmology

## 2019-10-13 ENCOUNTER — Other Ambulatory Visit: Payer: Self-pay

## 2019-10-13 DIAGNOSIS — H34811 Central retinal vein occlusion, right eye, with macular edema: Secondary | ICD-10-CM | POA: Diagnosis not present

## 2019-10-13 DIAGNOSIS — I10 Essential (primary) hypertension: Secondary | ICD-10-CM | POA: Diagnosis not present

## 2019-10-13 DIAGNOSIS — H35033 Hypertensive retinopathy, bilateral: Secondary | ICD-10-CM | POA: Diagnosis not present

## 2019-10-13 DIAGNOSIS — H43813 Vitreous degeneration, bilateral: Secondary | ICD-10-CM

## 2019-12-01 ENCOUNTER — Encounter (INDEPENDENT_AMBULATORY_CARE_PROVIDER_SITE_OTHER): Payer: BC Managed Care – PPO | Admitting: Ophthalmology

## 2019-12-11 ENCOUNTER — Other Ambulatory Visit: Payer: Self-pay

## 2019-12-11 ENCOUNTER — Encounter (INDEPENDENT_AMBULATORY_CARE_PROVIDER_SITE_OTHER): Payer: Medicare Other | Admitting: Ophthalmology

## 2019-12-11 DIAGNOSIS — H34811 Central retinal vein occlusion, right eye, with macular edema: Secondary | ICD-10-CM

## 2019-12-11 DIAGNOSIS — I10 Essential (primary) hypertension: Secondary | ICD-10-CM | POA: Diagnosis not present

## 2019-12-11 DIAGNOSIS — H35033 Hypertensive retinopathy, bilateral: Secondary | ICD-10-CM | POA: Diagnosis not present

## 2019-12-11 DIAGNOSIS — H43813 Vitreous degeneration, bilateral: Secondary | ICD-10-CM

## 2019-12-11 DIAGNOSIS — H2513 Age-related nuclear cataract, bilateral: Secondary | ICD-10-CM

## 2020-02-05 ENCOUNTER — Other Ambulatory Visit: Payer: Self-pay

## 2020-02-05 ENCOUNTER — Encounter (INDEPENDENT_AMBULATORY_CARE_PROVIDER_SITE_OTHER): Payer: Medicare Other | Admitting: Ophthalmology

## 2020-02-05 DIAGNOSIS — I10 Essential (primary) hypertension: Secondary | ICD-10-CM | POA: Diagnosis not present

## 2020-02-05 DIAGNOSIS — H35033 Hypertensive retinopathy, bilateral: Secondary | ICD-10-CM

## 2020-02-05 DIAGNOSIS — H348112 Central retinal vein occlusion, right eye, stable: Secondary | ICD-10-CM

## 2020-02-05 DIAGNOSIS — H43813 Vitreous degeneration, bilateral: Secondary | ICD-10-CM

## 2023-09-30 ENCOUNTER — Emergency Department (HOSPITAL_BASED_OUTPATIENT_CLINIC_OR_DEPARTMENT_OTHER)
Admission: EM | Admit: 2023-09-30 | Discharge: 2023-09-30 | Disposition: A | Discharge status: Home / Self Care | Payer: Medicare (Managed Care) | Attending: Emergency Medicine | Admitting: Emergency Medicine

## 2023-09-30 ENCOUNTER — Other Ambulatory Visit: Payer: Self-pay

## 2023-09-30 ENCOUNTER — Encounter (HOSPITAL_BASED_OUTPATIENT_CLINIC_OR_DEPARTMENT_OTHER): Payer: Self-pay | Admitting: Emergency Medicine

## 2023-09-30 ENCOUNTER — Emergency Department (HOSPITAL_BASED_OUTPATIENT_CLINIC_OR_DEPARTMENT_OTHER): Payer: Medicare (Managed Care)

## 2023-09-30 DIAGNOSIS — I10 Essential (primary) hypertension: Secondary | ICD-10-CM | POA: Insufficient documentation

## 2023-09-30 DIAGNOSIS — M79641 Pain in right hand: Secondary | ICD-10-CM | POA: Insufficient documentation

## 2023-09-30 DIAGNOSIS — M7989 Other specified soft tissue disorders: Secondary | ICD-10-CM | POA: Diagnosis not present

## 2023-09-30 DIAGNOSIS — M25531 Pain in right wrist: Secondary | ICD-10-CM | POA: Insufficient documentation

## 2023-09-30 DIAGNOSIS — E119 Type 2 diabetes mellitus without complications: Secondary | ICD-10-CM | POA: Insufficient documentation

## 2023-09-30 MED ORDER — AMOXICILLIN-POT CLAVULANATE 875-125 MG PO TABS: 1.0000 | ORAL_TABLET | Freq: Two times a day (BID) | ORAL | 0 refills | Status: AC

## 2023-09-30 NOTE — ED Provider Notes (Signed)
 [place] EMERGENCY DEPARTMENT AT [facility] Provider Note  CSN: [id number] Arrival date & time: 09/30/23 1714  Chief Complaint(s) Hand Pain  HPI Mercedes Riggs is a 73 y.o. female history of diabetes, hypertension presenting to the emergency department swelling and pain to the right hand and wrist.  She denies any trauma or falls.  No fevers or chills.  No wound.  Feels that it is warm and tender.  No other symptoms.  No history of DVT or PE.   Past Medical History Past Medical History:  Diagnosis Date   Bursitis    Diabetes mellitus without complication (HCC)    GERD (gastroesophageal reflux disease)    Hypertension    Migraines    Patient Active Problem List   Diagnosis Date Noted   Chest pain 12/09/2016   Syncope 10/13/2014   HTN (hypertension) 10/13/2014   Diabetes mellitus (HCC) 10/13/2014   Home Medication(s) Prior to Admission medications   Medication Sig Start Date End Date Taking? Authorizing Provider  amoxicillin-clavulanate (AUGMENTIN) 875-125 MG tablet Take 1 tablet by mouth every 12 (twelve) hours. 09/30/23  Yes Mordecai Applebaum, MD  Artificial Tear Ointment (DRY EYES OP) Place 1 drop into both eyes daily as needed (dry eyes).    [provider]  hydrALAZINE  (APRESOLINE ) 10 MG tablet Take 1 tablet (10 mg total) by mouth 3 (three) times daily. 12/10/16   Johnson, Clanford L, MD  omeprazole (PRILOSEC) 20 MG capsule Take 20 mg by mouth daily.    [provider]  ondansetron  (ZOFRAN  ODT) 4 MG disintegrating tablet Take 1 tablet (4 mg total) by mouth every 8 (eight) hours as needed for nausea or vomiting. 06/27/17   Mackuen, Courteney Lyn, MD  VENTOLIN HFA 108 (90 BASE) MCG/ACT inhaler Inhale 2 puffs into the lungs every 4 (four) hours as needed for wheezing or shortness of breath.  09/24/14   [provider]                                                                                                                                     Past Surgical History Past Surgical History:  Procedure Laterality Date   ABDOMINAL HYSTERECTOMY     LUNG REMOVAL, PARTIAL     Family History Family History  Problem Relation Age of Onset   Diabetes Mellitus II Father    Diabetes Mellitus II Sister    Diabetes Mellitus II Brother    CAD Neg Hx     Social History Social History   Tobacco Use   Smoking status: Never   Smokeless tobacco: Never  Substance Use Topics   Alcohol use: Yes    Comment: weekly   Drug use: No   Allergies Aspirin , Codeine, Percocet [oxycodone-acetaminophen ], Prednisone, Propoxyphene, and Tramadol  Review of Systems Review of Systems  All other systems reviewed and are negative.   Physical Exam Vital Signs  I have reviewed the  triage vital signs BP 133/76 (BP Location: Left Arm)   Pulse 100   Temp 98.6 F (37 C)   Resp 16   Ht 5\' 2"  (1.575 m)   Wt 78 kg   SpO2 100%   BMI 31.46 kg/m  Physical Exam Vitals and nursing note reviewed.  Constitutional:      General: She is not in acute distress.    Appearance: She is well-developed.  HENT:     Head: Normocephalic and atraumatic.     Mouth/Throat:     Mouth: Mucous membranes are moist.  Eyes:     Pupils: Pupils are equal, round, and reactive to light.  Cardiovascular:     Rate and Rhythm: Normal rate and regular rhythm.     Heart sounds: No murmur heard. Pulmonary:     Effort: Pulmonary effort is normal. No respiratory distress.     Breath sounds: Normal breath sounds.  Abdominal:     General: Abdomen is flat.     Palpations: Abdomen is soft.     Tenderness: There is no abdominal tenderness.  Musculoskeletal:        General: No tenderness.     Right lower leg: No edema.     Left lower leg: No edema.     Comments: Over the dorsal hand, wrist, and distal forearm there is some slight soft tissue swelling with warmth.  No obvious erythema.  Able to range the wrist without difficulty..  2+ radial pulse.  No other upper extremity  swelling  Skin:    General: Skin is warm and dry.  Neurological:     General: No focal deficit present.     Mental Status: She is alert. Mental status is at baseline.  Psychiatric:        Mood and Affect: Mood normal.        Behavior: Behavior normal.     ED Results and Treatments Labs (all labs ordered are listed, but only abnormal results are displayed) Labs Reviewed - No data to display                                                                                                                        Radiology DG Hand Complete Right Result Date: 09/30/2023 CLINICAL DATA:  Swelling. Acute onset of right hand pain. No known injury EXAM: RIGHT HAND - COMPLETE 3+ VIEW COMPARISON:  None Available. FINDINGS: There is no evidence of fracture or dislocation. 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 No erosions or periostitis. No focal soft tissue abnormalities. IMPRESSION: Scattered osteoarthritis. No acute findings. Electronically Signed   By: Chadwick Colonel M.D.   On: 09/30/2023 19:09    Pertinent labs & imaging results that were available during my care of the patient were reviewed by me and considered in my medical decision making (see MDM for details).  Medications Ordered in ED Medications - No data to display  Procedures Procedures  (including critical care time)  Medical Decision Making / ED Course   MDM:  73 year old presenting with hand and wrist pain.  Patient well-appearing, physical examination with area of soft tissue swelling, warmth and tenderness without obvious erythema.  Doubt musculoskeletal injury, x-ray was obtained in triage, no fracture, patient denies trauma to suggest sprain.  Circulation is intact.  Doubt DVT with only very slight localized area of swelling patient able to range the wrist, doubt  septic joint or gout.  Suspect most likely cause is cellulitis although patient has recently been on amoxicillin which should have coverage for strep infection.  Will broaden to Augmentin.  Given unclear cause of symptoms, recommended follow-up with hand surgery as needed and strict ER precautions for any worsening       Additional history obtained: -Additional history obtained from family    Imaging Studies ordered: I ordered imaging studies including XR wrist/hand On my interpretation imaging demonstrates no acute process I independently visualized and interpreted imaging. I agree with the radiologist interpretation   Medicines ordered and prescription drug management: Meds ordered this encounter  Medications   amoxicillin-clavulanate (AUGMENTIN) 875-125 MG tablet    Sig: Take 1 tablet by mouth every 12 (twelve) hours.    Dispense:  14 tablet    Refill:  0    -I have reviewed the patients home medicines and have made adjustments as needed  Social Determinants of Health:  Diagnosis or treatment significantly limited by social determinants of health: obesity   Co morbidities that complicate the patient evaluation  Past Medical History:  Diagnosis Date   Bursitis    Diabetes mellitus without complication (HCC)    GERD (gastroesophageal reflux disease)    Hypertension    Migraines       Dispostion: Disposition decision including need for hospitalization was considered, and patient discharged from emergency department.    Final Clinical Impression(s) / ED Diagnoses Final diagnoses:  Hand pain, right     This chart was dictated using voice recognition software.  Despite best efforts to proofread,  errors can occur which can change the documentation meaning.    Mordecai Applebaum, MD 09/30/23 [phone]

## 2023-09-30 NOTE — ED Triage Notes (Signed)
 Pt c/o RT hand swelling and pain that started Fri or Sat; no injury

## 2023-09-30 NOTE — Discharge Instructions (Addendum)
 We evaluated you for your hand pain and swelling.  We believe the most likely cause of your symptoms is a skin infection.  Your x-ray was negative for any fractures or other dangerous finding.  Your circulation is normal.  We do not think that you have a blood clot in your arm.   Since you have already been on an antibiotic, we have prescribed you a new antibiotic called Augmentin, which is a stronger version of amoxicillin.  Please also keep your hand elevated and ice it as needed for pain and discomfort.  You can also take at 1000 mg of Tylenol  every 6 hours as needed for pain.  If your symptoms are persistent or not improving, I would recommend following up with a hand specialist.  You can call Dr. Agatha Horsfall for follow-up.  We do not think your symptoms are caused by a dangerous problem such as a joint infection, but if you have any new or worsening symptoms such as fevers, increasing pain or swelling, you develop a wound, you have trouble moving your wrist, or you have severe uncontrolled pain, please return so we can check your wrist again.

## 2023-09-30 NOTE — ED Notes (Signed)
 Discharge instructions reviewed with patient. Patient questions answered and opportunity for education reviewed. Patient voices understanding of discharge instructions with no further questions. Patient ambulatory with steady gait to lobby.
